# Patient Record
Sex: Female | Born: 1935 | Race: White | Hispanic: No | Marital: Married
Health system: Southern US, Community
[De-identification: ages and names within clinical notes are randomized; demographics above are authoritative.]

## PROBLEM LIST (undated history)

## (undated) DIAGNOSIS — F09 Unspecified mental disorder due to known physiological condition: Secondary | ICD-10-CM

## (undated) DIAGNOSIS — I10 Essential (primary) hypertension: Secondary | ICD-10-CM

## (undated) DIAGNOSIS — M6281 Muscle weakness (generalized): Secondary | ICD-10-CM

## (undated) DIAGNOSIS — R489 Unspecified symbolic dysfunctions: Secondary | ICD-10-CM

## (undated) DIAGNOSIS — S065X9A Traumatic subdural hemorrhage with loss of consciousness of unspecified duration, initial encounter: Secondary | ICD-10-CM

## (undated) DIAGNOSIS — R279 Unspecified lack of coordination: Secondary | ICD-10-CM

## (undated) DIAGNOSIS — S065XAA Traumatic subdural hemorrhage with loss of consciousness status unknown, initial encounter: Secondary | ICD-10-CM

## (undated) DIAGNOSIS — R262 Difficulty in walking, not elsewhere classified: Secondary | ICD-10-CM

## (undated) HISTORY — PX: BRAIN SURGERY: SHX531

---

## 2000-07-04 ENCOUNTER — Encounter: Payer: Self-pay | Admitting: Internal Medicine

## 2000-07-04 ENCOUNTER — Encounter: Admission: RE | Admit: 2000-07-04 | Discharge: 2000-07-04 | Payer: Self-pay | Admitting: Internal Medicine

## 2001-07-05 ENCOUNTER — Encounter: Payer: Self-pay | Admitting: Internal Medicine

## 2001-07-05 ENCOUNTER — Encounter: Admission: RE | Admit: 2001-07-05 | Discharge: 2001-07-05 | Payer: Self-pay | Admitting: Internal Medicine

## 2002-07-08 ENCOUNTER — Encounter: Admission: RE | Admit: 2002-07-08 | Discharge: 2002-07-08 | Payer: Self-pay | Admitting: Internal Medicine

## 2002-07-08 ENCOUNTER — Encounter: Payer: Self-pay | Admitting: Internal Medicine

## 2003-07-10 ENCOUNTER — Encounter: Admission: RE | Admit: 2003-07-10 | Discharge: 2003-07-10 | Payer: Self-pay | Admitting: Internal Medicine

## 2003-07-10 ENCOUNTER — Encounter: Payer: Self-pay | Admitting: Internal Medicine

## 2003-08-21 ENCOUNTER — Inpatient Hospital Stay (HOSPITAL_COMMUNITY): Admission: EM | Admit: 2003-08-21 | Discharge: 2003-08-24 | Payer: Self-pay | Admitting: Emergency Medicine

## 2003-08-22 ENCOUNTER — Encounter: Payer: Self-pay | Admitting: Cardiology

## 2004-07-19 ENCOUNTER — Encounter: Admission: RE | Admit: 2004-07-19 | Discharge: 2004-07-19 | Payer: Self-pay | Admitting: Internal Medicine

## 2004-12-27 ENCOUNTER — Ambulatory Visit (HOSPITAL_COMMUNITY): Admission: RE | Admit: 2004-12-27 | Discharge: 2004-12-27 | Payer: Self-pay | Admitting: Family Medicine

## 2005-07-20 ENCOUNTER — Ambulatory Visit (HOSPITAL_COMMUNITY): Admission: RE | Admit: 2005-07-20 | Discharge: 2005-07-20 | Payer: Self-pay | Admitting: Family Medicine

## 2005-08-17 ENCOUNTER — Ambulatory Visit (HOSPITAL_COMMUNITY): Admission: RE | Admit: 2005-08-17 | Discharge: 2005-08-17 | Payer: Self-pay | Admitting: Family Medicine

## 2006-02-15 ENCOUNTER — Ambulatory Visit (HOSPITAL_COMMUNITY): Admission: RE | Admit: 2006-02-15 | Discharge: 2006-02-15 | Payer: Self-pay | Admitting: Family Medicine

## 2006-08-21 ENCOUNTER — Ambulatory Visit (HOSPITAL_COMMUNITY): Admission: RE | Admit: 2006-08-21 | Discharge: 2006-08-21 | Payer: Self-pay | Admitting: Family Medicine

## 2006-10-31 ENCOUNTER — Ambulatory Visit (HOSPITAL_COMMUNITY): Admission: RE | Admit: 2006-10-31 | Discharge: 2006-10-31 | Payer: Self-pay | Admitting: Family Medicine

## 2007-03-27 ENCOUNTER — Inpatient Hospital Stay (HOSPITAL_COMMUNITY): Admission: AD | Admit: 2007-03-27 | Discharge: 2007-04-03 | Payer: Self-pay | Admitting: Neurological Surgery

## 2007-03-27 ENCOUNTER — Encounter: Payer: Self-pay | Admitting: Emergency Medicine

## 2007-04-03 ENCOUNTER — Inpatient Hospital Stay
Admission: AD | Admit: 2007-04-03 | Discharge: 2011-12-07 | Disposition: A | Discharge status: Nursing Home (Includes ICF) | Payer: Self-pay | Source: Home / Self Care | Attending: Internal Medicine | Admitting: Internal Medicine

## 2007-04-03 DIAGNOSIS — R05 Cough: Secondary | ICD-10-CM

## 2007-09-27 ENCOUNTER — Ambulatory Visit (HOSPITAL_COMMUNITY): Admission: RE | Admit: 2007-09-27 | Discharge: 2007-09-27 | Payer: Self-pay | Admitting: Internal Medicine

## 2009-01-11 IMAGING — CR DG HIP (WITH OR WITHOUT PELVIS) 2-3V*L*
3 series · 3 of 3 positions shown · non-contrast
Comparison: None.

CLINICAL DATA: Status post fall.  Left knee and hip pain.  
LEFT HIP ? 3 VIEW:

[view not recorded (1 of 3)]
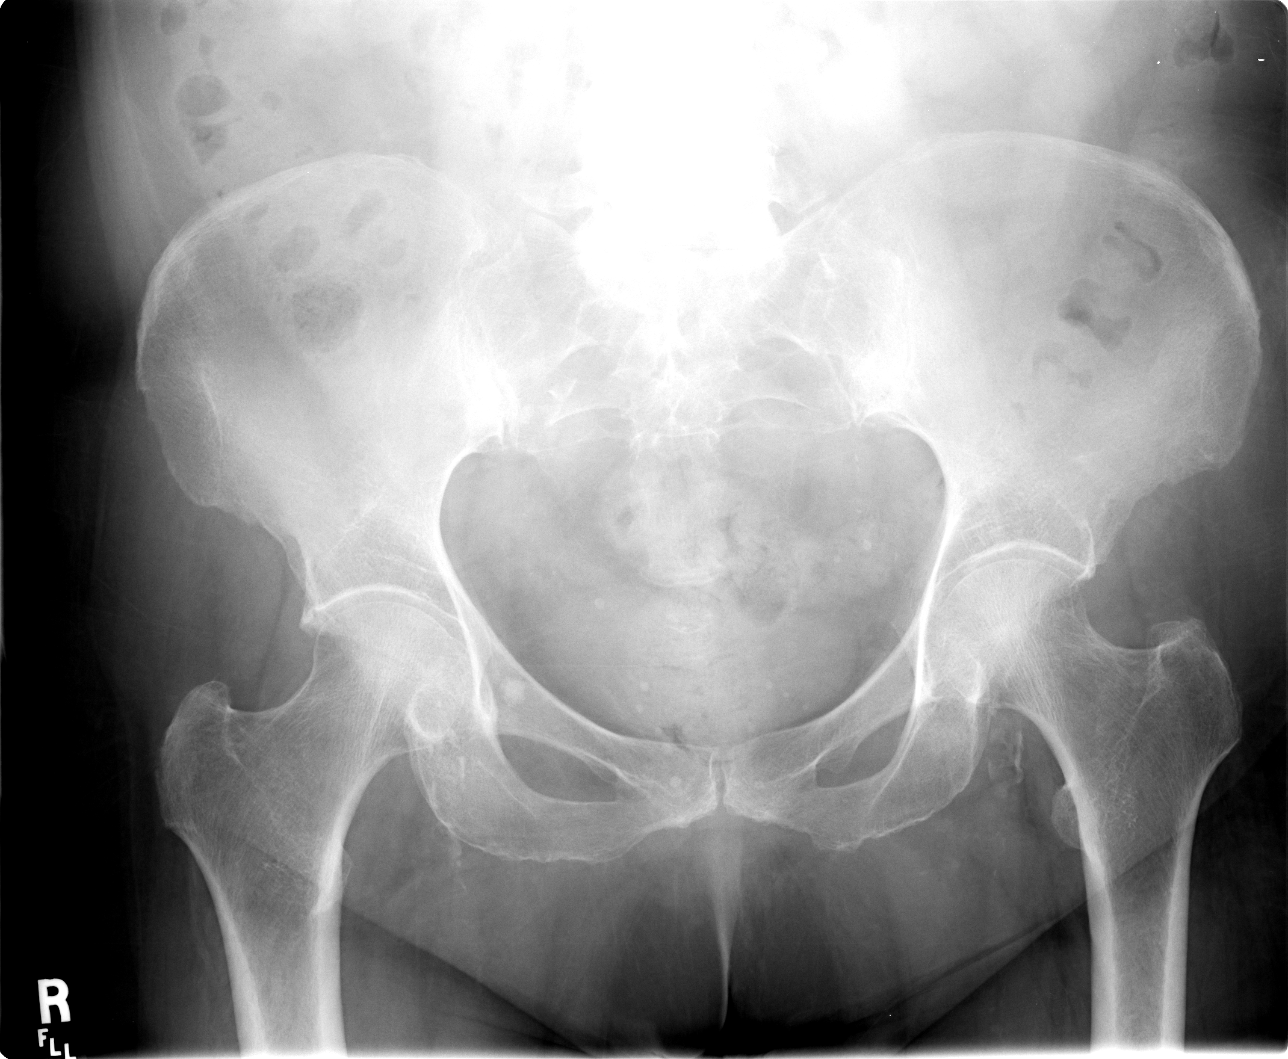

[view not recorded (2 of 3)]
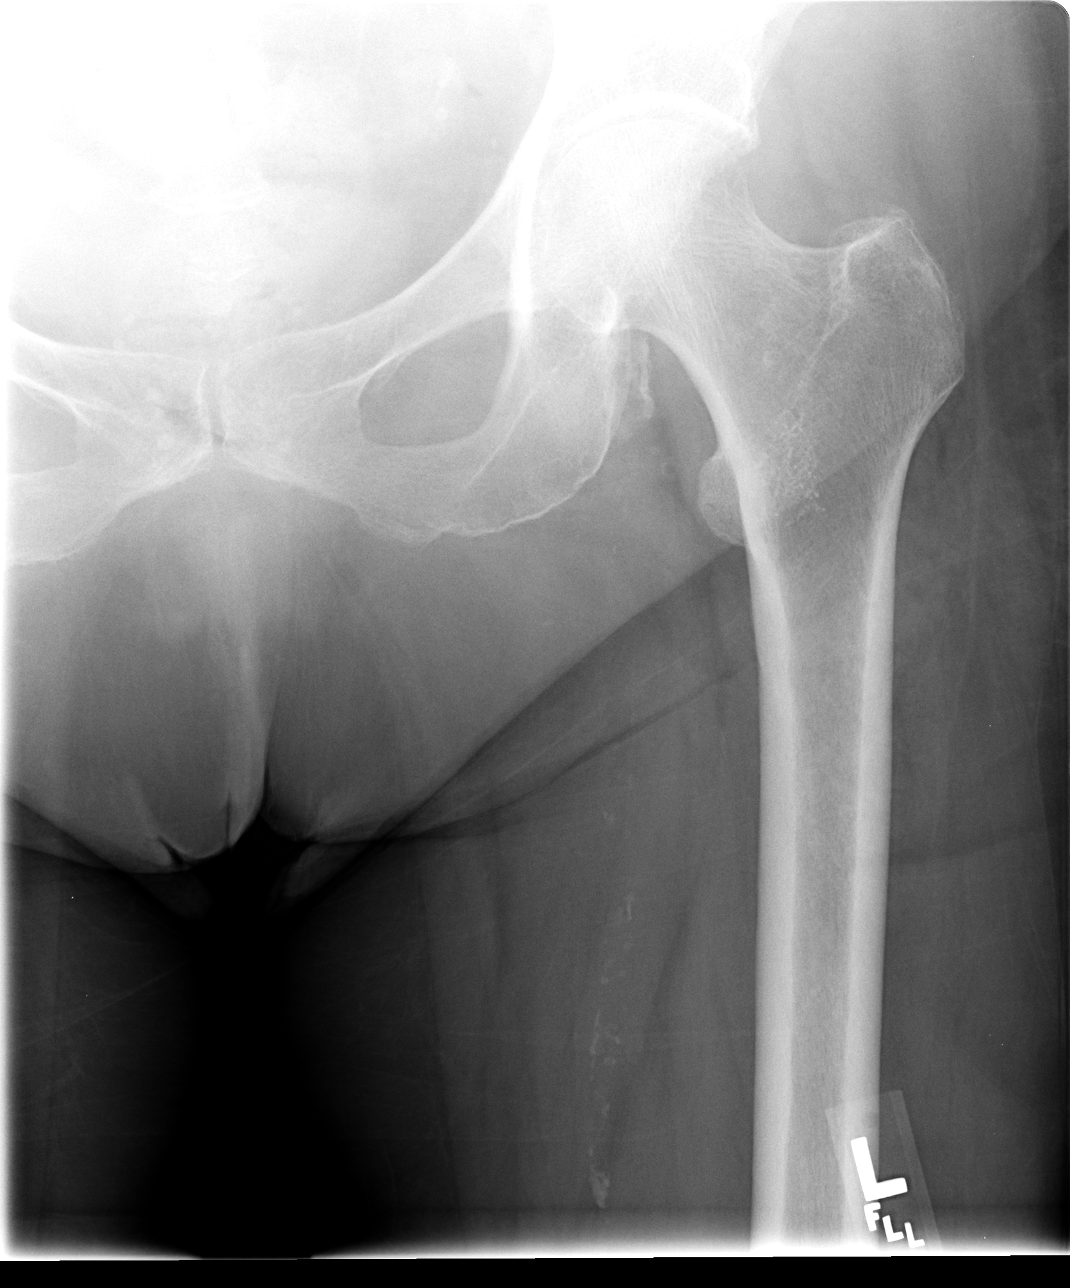

[view not recorded (3 of 3)]
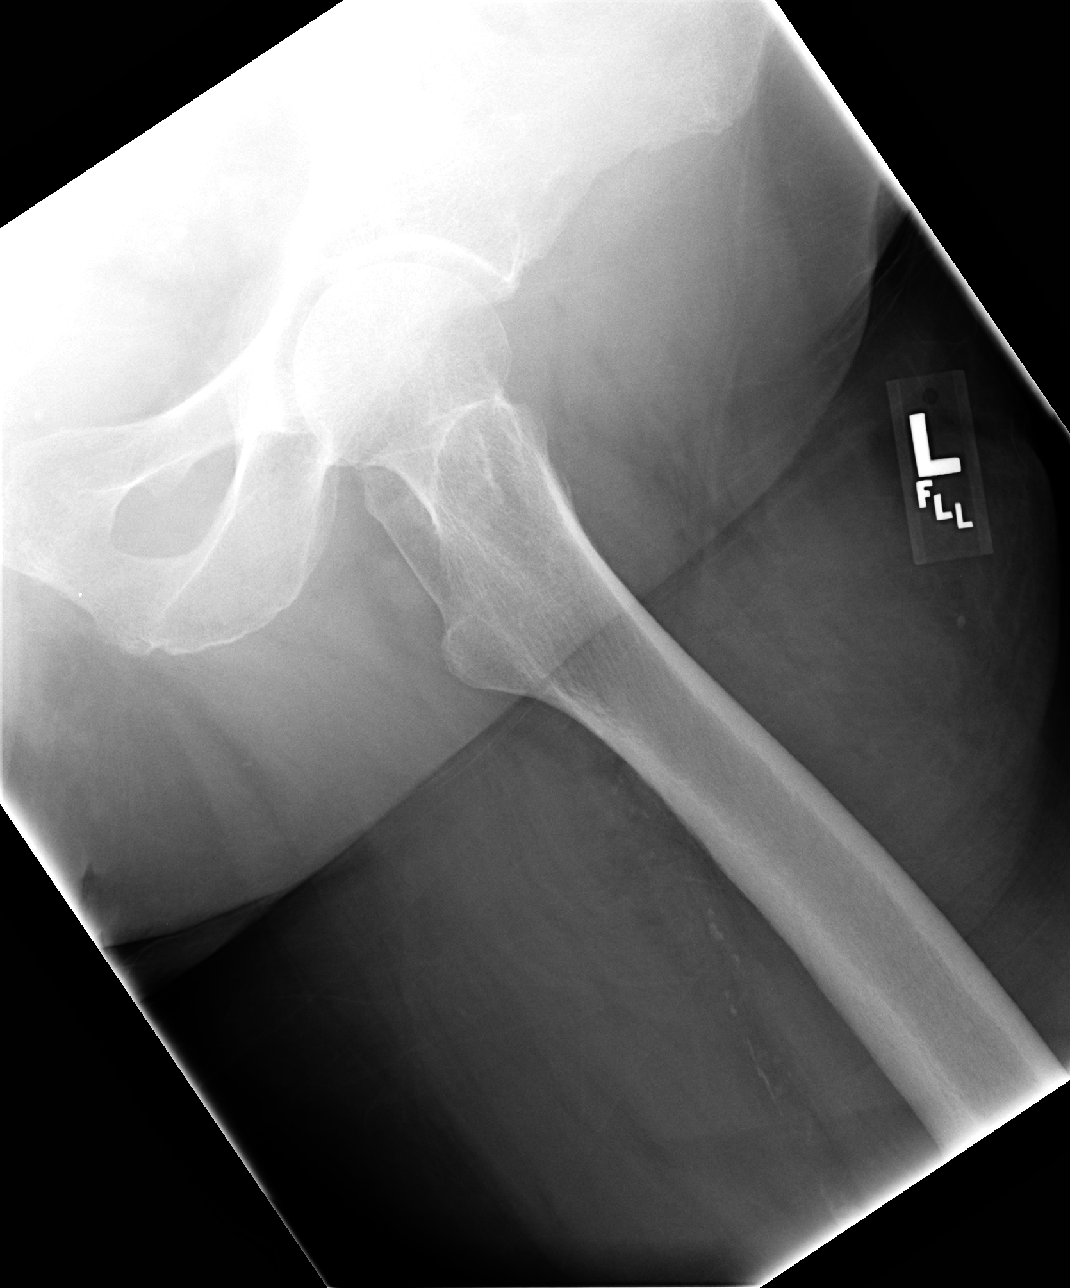

[3 of 3 positions shown; findings below may reference images not displayed]

FINDINGS: There is no evidence of acute fracture or dislocation.  Mild degenerative changes of both hips are present.  The mineralization appears adequate for age.  Vascular calcifications are noted.
IMPRESSION: No evidence of acute fracture or dislocation.  If the patient remains symptomatic, radiographic follow-up would be advised, as acute hip fractures can be radiographically occult in the elderly.  
LEFT KNEE ? 4 VIEW:
FINDINGS: There is prominent prepatellar soft tissue swelling.  No significant knee joint effusion is seen.  There is no evidence of acute fracture or dislocation.   There is minimal medial joint space loss and faint calcifications of the popliteal artery are noted.
IMPRESSION: Prepatellar soft tissue swelling.  No evidence of acute fracture or dislocation.

## 2009-09-21 ENCOUNTER — Emergency Department (HOSPITAL_COMMUNITY): Admission: EM | Admit: 2009-09-21 | Discharge: 2009-09-21 | Payer: Self-pay | Admitting: Emergency Medicine

## 2010-12-05 LAB — URINALYSIS, ROUTINE W REFLEX MICROSCOPIC
Bilirubin Urine: NEGATIVE
Glucose, UA: NEGATIVE mg/dL
Hgb urine dipstick: NEGATIVE
Ketones, ur: NEGATIVE mg/dL
Nitrite: POSITIVE — AB
Protein, ur: NEGATIVE mg/dL
Specific Gravity, Urine: 1.025 (ref 1.005–1.030)
Urobilinogen, UA: 0.2 mg/dL (ref 0.0–1.0)
pH: 5.5 (ref 5.0–8.0)

## 2010-12-05 LAB — COMPREHENSIVE METABOLIC PANEL
ALT: 23 U/L (ref 0–35)
AST: 27 U/L (ref 0–37)
Albumin: 3.5 g/dL (ref 3.5–5.2)
Alkaline Phosphatase: 73 U/L (ref 39–117)
BUN: 20 mg/dL (ref 6–23)
CO2: 29 mEq/L (ref 19–32)
Calcium: 9.1 mg/dL (ref 8.4–10.5)
Chloride: 103 mEq/L (ref 96–112)
Creatinine, Ser: 1.1 mg/dL (ref 0.4–1.2)
GFR calc Af Amer: 58 mL/min — ABNORMAL LOW (ref >60)
GFR calc non Af Amer: 48 mL/min — ABNORMAL LOW (ref >60)
Glucose, Bld: 114 mg/dL — ABNORMAL HIGH (ref 70–99)
Potassium: 4 mEq/L (ref 3.5–5.1)
Sodium: 141 mEq/L (ref 135–145)
Total Bilirubin: 0.4 mg/dL (ref 0.3–1.2)
Total Protein: 6.9 g/dL (ref 6.0–8.3)

## 2010-12-05 LAB — POCT CARDIAC MARKERS
CKMB, poc: <1 ng/mL — ABNORMAL LOW (ref 1.0–8.0)
Myoglobin, poc: 53.1 ng/mL (ref 12–200)
Troponin i, poc: <0.05 ng/mL (ref 0.00–0.09)

## 2010-12-05 LAB — DIFFERENTIAL
Basophils Absolute: 0 10*3/uL (ref 0.0–0.1)
Basophils Relative: 0 % (ref 0–1)
Eosinophils Absolute: 0.1 10*3/uL (ref 0.0–0.7)
Eosinophils Relative: 1 % (ref 0–5)
Lymphocytes Relative: 29 % (ref 12–46)
Lymphs Abs: 2.3 10*3/uL (ref 0.7–4.0)
Monocytes Absolute: 0.5 10*3/uL (ref 0.1–1.0)
Monocytes Relative: 6 % (ref 3–12)
Neutro Abs: 5 10*3/uL (ref 1.7–7.7)
Neutrophils Relative %: 63 % (ref 43–77)

## 2010-12-05 LAB — CBC
HCT: 39.4 % (ref 36.0–46.0)
Hemoglobin: 12.9 g/dL (ref 12.0–15.0)
MCHC: 32.7 g/dL (ref 30.0–36.0)
MCV: 89.4 fL (ref 78.0–100.0)
Platelets: 224 10*3/uL (ref 150–400)
RBC: 4.4 MIL/uL (ref 3.87–5.11)
RDW: 13.5 % (ref 11.5–15.5)
WBC: 8 10*3/uL (ref 4.0–10.5)

## 2010-12-05 LAB — URINE CULTURE: Colony Count: >=100000

## 2010-12-05 LAB — URINE MICROSCOPIC-ADD ON

## 2011-02-01 NOTE — Group Therapy Note (Signed)
NAME:  Madeline Alvarez, Madeline Alvarez NO.:  [id number]   MEDICAL RECORD NO.:  [id number]          PATIENT TYPE:  ORB   LOCATION:  S141                          FACILITY:  [facility]   PHYSICIAN:  Catalina Pizza, M.D.        DATE OF BIRTH:  1925-07-13   DATE OF PROCEDURE:  DATE OF DISCHARGE:                                 PROGRESS NOTE   SUBJECTIVE:  Madeline Alvarez is an 77 year old white female, resident at  [facility].  She did not have any complaints.  No significant  changes per staff.  She has not had as many episodes lashing out at  staff and residents, does have a calm demeanor at this time.  She is  eating, drinking well, ambulating without difficulty.   OBJECTIVE:  VITAL SIGNS:  Stable.  GENERAL:  This is a pleasant female sitting in chair in no acute  distress.  HEENT:  Unremarkable.  CARDIOVASCULAR:  Regular rate and rhythm.  LUNGS:  Clear to auscultation bilaterally.  ABDOMEN:  Soft, mildly protuberant, but nontender, positive bowel  sounds.  EXTREMITIES:  No lower extremity edema.  NEUROLOGIC:  Patient is alert and oriented to person and place, not  specific to time, but able to answer questions and follow directions  appropriately.   No new laboratory data obtained thus far.   ASSESSMENT AND PLAN:  1. Hypertension, under good control.  2. Alzheimer dementia.  Continue on Aricept, Namenda.  3. Anxiety and agitation.  We will continue on her current therapy      with the Lexapro and Xanax and increase her Risperdal if needed.  4. Coronary artery disease, history of transient ischemic attack.      Continue on aspirin.  5. Hyperlipidemia.  Continue on Lipitor.  We will check her upcoming      blood work to see if anything needs to be changed, but continue on      current therapy.   DISPOSITION:  We will continue to monitor and follow up as needed.      Catalina Pizza, M.D.     ZH/MEDQ  D:  05/18/2009  T:  05/19/2009  Job:  [id number]

## 2011-02-01 NOTE — Group Therapy Note (Signed)
NAME:  JAYDAH, STAHLE NO.:  [id number]   MEDICAL RECORD NO.:  [id number]          PATIENT TYPE:  ORB   LOCATION:  s141                          FACILITY:  [facility]   PHYSICIAN:  Catalina Pizza, M.D.        DATE OF BIRTH:  01/09/1925   DATE OF PROCEDURE:  12/04/2008  DATE OF DISCHARGE:                                 PROGRESS NOTE   SUBJECTIVE:  Ms. Jarrett is an 77 year old white female resident of  [facility].  She has no complaints at this time.  Did ambulate  with the patient down the hall without any difficulty.  She is eating  and drinking well.  Per the nursing, she has had a little bit of  sundowning but is good on some antianxiety medicines in the evening.   PHYSICAL EXAMINATION:  VITAL SIGNS:  Temperature is 97.8, blood pressure  120/78, up to as high of 147/77, respiratory rate is 16-20, heart rate  is 57.  GENERAL:  This is a pleasant white female in no acute distress.  HEENT:  Unremarkable.  CARDIOVASCULAR:  Regular rate and rhythm.  LUNGS:  Clear to auscultation bilaterally.  ABDOMEN:  Soft, nontender, nondistended, positive bowel sounds.  EXTREMITIES:  No lower extremity edema.  NEUROLOGIC:  The patient is alert and oriented to person and place and  may be not specific to time.  She does have significant forgetfulness  and continued to ask me whether I was her doctor.  She does answer all  the questions appropriately and is able to do most ADLs for herself of  course with assistance with meals from the nursing home.   IMPRESSION:  An 77 year old white female with chronic mild Alzheimer  dementia, resident of nursing facility.   ASSESSMENT AND PLAN:  1. Alzheimer dementia.  Continue with Aricept and Namenda.  2. Anxiety and depression.  Continue with the Lexapro as well as the      Ativan for sundowning-type agitation and hyperlipidemia.  Continue      with Lipitor, coronary artery disease, history of TIAs.  Continue      on aspirin.  3.  Hypertension.  Doing well.  Continue on current therapy.  Continue      to monitor monthly.      Catalina Pizza, M.D.  Electronically Signed     ZH/MEDQ  D:  12/21/2008  T:  12/21/2008  Job:  [id number]

## 2011-02-01 NOTE — Group Therapy Note (Signed)
NAME:  Madeline Alvarez, Madeline Alvarez NO.:  [id number]   MEDICAL RECORD NO.:  [id number]          PATIENT TYPE:  ORB   LOCATION:  S141                          FACILITY:  [facility]   PHYSICIAN:  Catalina Pizza, M.D.        DATE OF BIRTH:  16-Oct-1924   DATE OF PROCEDURE:  12/06/2007  DATE OF DISCHARGE:                                 PROGRESS NOTE   SUBJECTIVE:  Madeline Alvarez is an 77 year old white female resident at  [facility] who, at this time, does not have any significant  complaints.  She is doing well.  She is recovering from cold and cough-  type symptoms which she had back in January with no other sequela this  time.  She states she is eating, drinking well, sleeping well, and  really overall doing much better.   OBJECTIVE:  VITAL SIGNS:  Temperature is 97.3, blood pressure is 181/71  today, was normal last check last week.  Heart rate is 52, respirations  are 18.  GENERAL:  This is a pleasant elderly female sitting in chair in no acute  distress.  HEENT:  Is unremarkable.  HEART:  Is regular rate and rhythm.  No murmurs, gallops or rubs.  LUNGS:  Are clear to auscultation bilaterally.  No rhonchi or wheezing.  ABDOMEN:  Is soft, nontender, positive bowel sounds.  EXTREMITIES:  No lower extremity edema.  MUSCULOSKELETAL:  No deficits noted.  NEUROLOGIC:  The patient is alert and oriented to person and place not  specifically to time.  Emotionally, she appears to be doing well.   IMPRESSION:  This is an 77 year old white female with some Alzheimer's  dementia, also possibly some sequela from previous subdural hematoma.   ASSESSMENT/PLAN:  1. Alzheimer's dementia.  Continue on the Aricept and Namenda.      Appears to be keeping things stable at this time.  2. Anxiety/depression.  Continue on Lexapro at 20 mg once daily.      Appears to be working well at this time.  She does have bouts of      some getting down in the dumps but, overall, appears to be stable.  3.  Hyperlipidemia, carotid artery disease, history of transient      ischemic attacks.  Continue on Lipitor and Tricor as previous      prescribed.  4. Hypertension.  Unclear why her blood pressure was up today.  She is      on atenolol and clonidine patch.  Will get this monitored daily for      the next week and to alert me if she is having systolic blood      pressure greater than 160 because may need to add further      medications onto her regimen.   DISPOSITION:  The patient will continue on current therapy and no  specific change.  Will follow up routine evaluation in another month.      Catalina Pizza, M.D.  Electronically Signed     ZH/MEDQ  D:  12/06/2007  T:  12/06/2007  Job:  [id number]

## 2011-02-01 NOTE — Group Therapy Note (Signed)
NAME:  Madeline Alvarez, SEGALL NO.:  [id number]   MEDICAL RECORD NO.:  [id number]          PATIENT TYPE:  ORB   LOCATION:  S141                          FACILITY:  [facility]   PHYSICIAN:  Catalina Pizza, M.D.        DATE OF BIRTH:  04-23-1925   DATE OF PROCEDURE:  10/17/2007  DATE OF DISCHARGE:                                 PROGRESS NOTE   SUBJECTIVE:  Ms. Glanz is an 77 year old white female resident of  [facility] who apparently has had cough and cold-type symptoms  for several weeks now.  Her daughter was more alarmed at her lethargic  nature.  Apparently when she had visited and with her further checked  out, she has had a negative chest x-ray recently and in discussion with  the patient states that her cough, although continued, is not keeping  her up at night and is improved from previous.  She is not bringing up  much when she coughs at this time, but when she does it does have a  yellow-green sputum.  She denies any fever or chills at this time.   OBJECTIVE:  VITAL SIGNS:  Temperature is 98.6, blood pressure is 128/75,  pulse is 75.  Respirations are 16.  She is satting 98% on room air.  GENERAL:  This is a pleasant elderly female sitting in chair in no acute  distress.  HEENT is unremarkable.  Heart is regular rate and rhythm.  No murmurs, gallops or rubs.  Lungs are clear to auscultation bilaterally, possibly some upper airway  noise but did not appreciate any crackles or rhonchi.  Abdomen is soft, nontender, positive bowel sounds.  EXTREMITIES:  No lower extremity edema.  MUSCULOSKELETAL:  No deficits.  NEUROLOGIC:  Patient is alert and oriented to person and place but not  specifically to time.  She is not as emotional as she was before.   IMPRESSION:  This is an 77 year old white female with some Alzheimer's  dementia who are apparently has had a prolonged URI with sputum  production.   ASSESSMENT/PLAN:  1. Upper respiratory infection.  It is  unclear.  She has been afebrile      but per report from her daughter,  she has never responded to just      Mucinex she has required antibiotics before.  She has no signs of      pneumonia, clinically, but given her green sputum production, will      go ahead and treat her with a 5-day course of Zithromax.  And      continue treat symptomatically with Robitussin.  She is having slow      improvement with her cough as compared to several weeks ago and it      does not appear to be causing any significant distress.  The      patient states she is sleeping well without any difficulty from a      cough.  She may have some reactive airway component, bronchitis      type component following a viral URI.  2. Dementia and appears  to be stable at this time.  3. Anxiety, depression she still does seem to be a little gloomy and      will continue on her Lexapro as previously prescribed.  Overall she      does appear to be better than she did several weeks ago and will      need to continue to nurture her in the nursing home environment to      help out with this.      Catalina Pizza, M.D.  Electronically Signed     ZH/MEDQ  D:  10/22/2007  T:  10/23/2007  Job:  [id number]

## 2011-02-01 NOTE — H&P (Signed)
Madeline Alvarez, Madeline Alvarez             ACCOUNT NO.:  [id number]   MEDICAL RECORD NO.:  [id number]          PATIENT TYPE:  INP   LOCATION:  3113                         FACILITY:  [facility]   PHYSICIAN:  Tia Alert, MD     DATE OF BIRTH:  10-03-1924   DATE OF ADMISSION:  03/27/2007  DATE OF DISCHARGE:                              HISTORY & PHYSICAL   ADMITTING DIAGNOSIS:  Left subdural hematoma after a fall.   HISTORY OF PRESENT ILLNESS:  Madeline Alvarez is an 77 year old female  with a history of Alzheimer's dementia and hypertension, who was  transferred from [facility] emergency department for further evaluation  and management of a subdural hematoma suffered after a fall some time  earlier this morning.  The patient has a sitter who stays with her  during the day, but leaves her alone at night.  Some time during the day  and night, or early hours of the morning, the patient apparently fell;  however, the patient has no recall of the event.  It is unknown whether  she had loss of consciousness.  Her son came over and noticed bruising  to her forehead.  They took her to the emergency department, where she  was found to have bruising to her left knee, swelling around her left  hip, and bruising to her face.   CT scan of the head and cervical spine was performed, and plain films of  the knee and left hip were performed.  The plain films showed no  evidence of fracture.  The CT scan was read as normal, but the CT scan  of the head showed a left acute subdural hematoma without significant  mass effect or shift, and neurosurgical evaluation was requested.  We  then accepted the patient in transfer here.   She denies any headaches.  She is awake and alert, and pleasant and  interactive, but has poor short-term memory.  She denies any significant  visual changes, or any numbness, tingling or weakness, and there has  been no apparent seizure activity.  She is on aspirin and Plavix for a  history of what sounds like a TIA, and has apparently carotid artery  disease according to the family.  She has had a more remote fall, which  was the cause of the swelling of the left knee.  This fall was  unwitnessed.   PAST MEDICAL HISTORY:  1. Hysterectomy.  2. Hypertension.  3. Alzheimer's dementia.  4. TIA.  5. Carotid artery disease.  6. Dyslipidemia.   MEDICATIONS:  1. Aricept 10 mg p.o. b.i.d.  2. Aspirin 81 mg p.o. daily.  3. Atenolol 50 mg p.o. daily.  4. Lipitor 80 mg daily.  5. Namenda 10 mg b.i.d.  6. Paxil 20 mg a day.  7. Plavix 75 mg a day.  8. Tricor 48 mg a day.   ALLERGIES:  No known drug allergies.   SOCIAL HISTORY:  Again, lives alone.  Is fairly independent, but has a  sitter who comes on with her during the day.   PHYSICAL EXAMINATION:  VITAL SIGNS:  Temperature 98.2,  pulse 54, blood  pressure 140/78, respirations 16, oxygen saturation is 96% on room air.  GENERAL:  Pleasant, cooperative, elderly female lying in a stretcher.  HEENT:  She has bruising and some swelling to the left forehead and to  the left occipital region.  There is no laceration.  Extraocular  movements are intact.  NECK:  Supple and nontender.  HEART:  Regular rate and rhythm.  EXTREMITIES:  She has swelling over the left hip and upper thigh  consistent with hematoma.  She has significant swelling and bruising  around the left knee, which appears to be old.  She is somewhat tender  around the swelling of the left hip, but seems to move the hip fine.  NEUROLOGICAL:  She is awake and alert.  She is pleasant and interactive,  but has poor short-term memory.  She is conversive.  No facial asymmetry  and no pronator drift.  Her strength seems to be good throughout.  Sensation is grossly intact.  Gait is not tested.   IMAGING STUDIES AND LABS:  Her CBC and electrolytes from the outside  institution were reviewed, and there are no significant abnormalities.  I cannot find any  coagulation studies that were done.  Her urinalysis  showed many bacteria and was positive for nitrites consistent with a  urinary tract infection.   CT scan shows a left frontal parietal subdural hematoma.  It is very  small without significant mass effect in the frontal region, but as it  gets to the parietal region, thickens to a maximum diameter of 1.3 cm  focally without significant shift or significant mass effect.  She has  significant atrophy of the brain.  The basal systems are open.  Do not  see a significant skull fracture.  CT scan of cervical spine shows no  obvious fracture, subluxation or deformity, and plain films of her hip  and left knee were reviewed and show no evidence of fracture per the  radiology report.   ASSESSMENT AND PLAN:  This is an 77 year old female with a history of  Alzheimer's dementia and possible transient ischemic attack with carotid  artery disease, who has had a couple of recent falls.  She has bruising  to her left hip and knee.  I do not believe we need an orthopedics  consult at this point, but we do need to watch the hematoma in the soft  tissues and make sure this does not expand, and make sure there is no  risk of compartment syndrome, but I do not think that is going to be an  issue.  The plain films look good according to the radiology report.  Cervical spine seems to be okay.  Her head CT shows an acute subdural  hematoma, but this does not need surgical treatment at this point unless  it were to grow and cause significant mass effect and neurologic  changes.  At this point, she seems to be at her neurologic baseline and  denies any headache.  We will repeat her head CT in the morning, sooner  if there is any change in her neurologic status.  We will hold her  aspirin and Plavix.  We will start physical and occupational therapy.  She will likely need nursing home placement upon discharge.  We will  keep her in the intensive care unit with  neurological checks every hour,  and we will go ahead and check coagulation studies on her at this point.  Tia Alert, MD  Electronically Signed    DSJ/MEDQ  D:  03/27/2007  T:  03/27/2007  Job:  [id number]

## 2011-02-01 NOTE — Group Therapy Note (Signed)
NAME:  Madeline Alvarez, Madeline Alvarez NO.:  [id number]   MEDICAL RECORD NO.:  [id number]           PATIENT TYPE:   LOCATION:  S141                          FACILITY:  [facility]   PHYSICIAN:  Catalina Pizza, M.D.        DATE OF BIRTH:  May 09, 1925   DATE OF PROCEDURE:  04/03/2008  DATE OF DISCHARGE:                                 PROGRESS NOTE   SUBJECTIVE:  Madeline Alvarez is an 77 year old white female resident at  [facility] who at this time has no complaints.  She states she  is eating will and doing well and not in any pains.   REVIEW OF SYSTEMS:  A full review of systems is negative.  No problems  with abdominal pain, constipation, or diarrhea.   OBJECTIVE:  VITAL SIGNS:  Temperature is 97.6, blood pressure 146/72,  pulse 60, and respirations 20.  GENERAL:  This is a pleasant white female, sitting in chair, in no acute  distress.  HEENT:  Unremarkable.  Pupils are equal, round, and reactive to light  and accommodation.  HEART:  Regular rate and rhythm.  No murmurs, gallops, or rubs.  LUNGS:  Clear to auscultation bilaterally.  No rhonchi or wheezing.  ABDOMEN:  Soft, nontender, and nondistended.  Positive bowel sounds.  EXTREMITIES:  No lower extremity edema.  MUSCULOSKELETAL:  Moving all extremities without difficulty, 5/5  strength.  NEUROLOGIC:  The patient is alert and oriented x3, may have a little  mental deficit, does not specifically remember me and kept questioning  me as far as who had sent me, but no other specific neurologic deficits.   IMPRESSION:  An 77 year old white female with dementia, in nurse  facility.   ASSESSMENT AND PLAN:  1. Alzheimer dementia, continue on the Aricept and Namenda.  2. Anxiety and depression, continue on the Lexapro, appears to be      working well.  3. Hyperlipidemia.  4. Carotid artery disease.  5. History of transient ischemic attack, continue Lipitor and TriCor,      preventive measures.  6. Hypertension.  Appears to be  doing well.  Continue to monitor at      this time.   DISPOSITION:  The patient appears to be doing well and we will continue  on her current therapy.      Catalina Pizza, M.D.  Electronically Signed    ZH/MEDQ  D:  04/03/2008  T:  04/04/2008  Job:  [id number]

## 2011-02-01 NOTE — Group Therapy Note (Signed)
NAME:  Madeline Alvarez, PLACKE NO.:  [id number]   MEDICAL RECORD NO.:  [id number]          PATIENT TYPE:  ORB   LOCATION:  S141                          FACILITY:  [facility]   PHYSICIAN:  Catalina Pizza, M.D.        DATE OF BIRTH:  May 14, 1925   DATE OF PROCEDURE:  07/24/2008  DATE OF DISCHARGE:                                 PROGRESS NOTE   SUBJECTIVE:  Madeline Alvarez is an 77 year old white female in usual  state of health at [facility] with no acute changes in her  chronic medical issues.  She states she is feeling fine, eating fine,  getting around without any difficulty at this time.   Medications were reviewed.   PHYSICAL EXAMINATION:  VITAL SIGNS:  Temperature was 98.0, blood  pressure was 128/72, pulse is 68, respirations were 16.  GENERAL:  This is a pleasant elderly female sitting in chair in no acute  distress.  HEENT:  Unremarkable.  LUNGS:  Clear to auscultation bilaterally.  No rhonchi or wheezing.  ABDOMEN:  Soft, nontender, nondistended, positive bowel sounds.  HEART:  Regular rate and rhythm.  No murmurs, gallops, or rubs.  EXTREMITIES:  No lower extremity edema.  NEUROLOGIC:  The patient is alert and oriented x3.  She does have some  forgetfulness, but is able to carry on a conversation well and answer  questions appropriately.  No other neurologic deficits noted.   IMPRESSION:  An 83-year white female with chronic mild Alzheimer  dementia, resident at nursing facility with no acute medical issues.   ASSESSMENT AND PLAN:  1. Alzheimer dementia.  Continue with Aricept and Namenda.  2. Anxiety and depression.  Continue with Lexapro at 10.  She appears      to be doing well at that dosage.  3. Hyperlipidemia.  We will continue on the Lipitor.  4. Carotid artery disease, history of transient ischemic attacks.      Continue with the aspirin as well as treat the hyperlipidemia.  5. Hypertension.  Continue with her current regimen.  With this, blood  pressure appear to be doing well.   DISPOSITION:  We will follow the patient in a month if further issues  arise.     Catalina Pizza, M.D.  Electronically Signed    ZH/MEDQ  D:  07/24/2008  T:  07/24/2008  Job:  [id number]

## 2011-02-01 NOTE — Group Therapy Note (Signed)
NAME:  Madeline Alvarez, Madeline Alvarez NO.:  [id number]   MEDICAL RECORD NO.:  [id number]          PATIENT TYPE:  ORB   LOCATION:  S141                           FACILITY:   PHYSICIAN:  Catalina Pizza, M.D.        DATE OF BIRTH:  July 05, 1925   DATE OF PROCEDURE:  09/29/2007  DATE OF DISCHARGE:                                 PROGRESS NOTE   SUBJECTIVE:  Madeline Alvarez is an 77 year old pleasant white female  resident of [facility] who apparently, for the last two or  three days has had worsening cough and overall has felt not that great.  Appears to have a upper respiratory infection.  Also of note, she has  become a little more confused and a little more reactive to both the  nurses as well as to her roommate.  She has been crying and emotional  more often for last several days as well.   PHYSICAL EXAMINATION:  VITAL SIGNS:  Temperature:  She has been  afebrile.  Her blood pressures have been 118/63, pulse is 72,  respirations 16.  GENERAL:  This is a pleasant, elderly white female somewhat tearful.  HEENT:  Unremarkable.  Mucous membranes are moist.  HEART:  Regular rate and rhythm.  LUNGS:  Clear to auscultation bilaterally.  Do not appreciate any  rhonchi or wheezing.  Question some upper airway noise noted.  ABDOMEN:  Soft, nontender, positive bowel sounds.  EXTREMITIES:  No lower extremity edema.  MUSCULOSKELETAL:  No deficits noted.  NEUROLOGIC:  The patient is alert and oriented to person and place, but  some of which she says is out of the ordinary.  She is more emotional  today than she was a month ago and saying that she just  found out that  some of her family members were sick and no one had told her.   LABORATORY DATA:  CBC showed white count 9.2, hemoglobin 12.1, platelet  count 298.  BMP was normal except for glucose mildly elevated 121, BUN  24, creatinine 0.97, this was nonfasting.  Urinalysis shows turbid,  nitrates were negative, leukocyte esterase was  trace, did not show any  WBCs, no RBCs and no bacteria noted.   Chest x-ray was also obtained which showed no active cardiopulmonary  disease, showed a 7 mm left upper lobe nodule.  Recommend further  evaluation with routine outpatient noncontrast CT of the chest.   IMPRESSION:  This is an 77 year old white female with Alzheimer's  dementia, very pleasant but motional at this time.  She states she  overall just does not feel good, has not felt good for the last couple  of days, has noted some cough with occasional sputum production, states  she is eating and drinking well without any problems.   ASSESSMENT/PLAN:  1. Alzheimer's dementia.  Continue with Aricept and Namenda.  2. Delirium/psychosis.  She was previously stable.  She is more      emotional at this time.  Will continue on the Lexapro.  Will not      make any major changes to her regimen.  3. Hyperlipidemia, carotid artery disease, history of transient      ischemic attacks.  Continue on her current regimen of Lipitor and      Tricor.  4. History of subdural hematoma.  No further problems related to this.  5. Upper respiratory infection.  Will start on Mucinex 600 mg p.o.      b.i.d. x7 days and this may help with some of this.  Given some of      her lab work, will encourage her to drink more fluids and encourage      her to eat as well.      Catalina Pizza, M.D.  Electronically Signed     ZH/MEDQ  D:  09/29/2007  T:  09/30/2007  Job:  [id number]

## 2011-02-01 NOTE — Discharge Summary (Signed)
NAMEFAYTHE, HEITZENRATER             ACCOUNT NO.:  [id number]   MEDICAL RECORD NO.:  [id number]          PATIENT TYPE:  INP   LOCATION:  3006                         FACILITY:  [facility]   PHYSICIAN:  Tia Alert, MD     DATE OF BIRTH:  14-Mar-1925   DATE OF ADMISSION:  03/27/2007  DATE OF DISCHARGE:  04/03/2007                         DISCHARGE SUMMARY - REFERRING   ADMISSION DIAGNOSIS:  Left subdural hematoma after a fall.   HISTORY OF PRESENT ILLNESS:  Ms. Mack is an 77 year old female  with a history of dementia who was transferred from [facility]  Emergency Department for further evaluation and management of a small  subdural hematoma suffered after a fall.  There was no witness to the  event.  The patient could not recall the event.  She had amnesia for the  event.  She was awake and alert, denied headache and moved all  extremities equally.  She was on aspirin and Plavix for a history of  suspected TIAs.  CT scan showed a small left subdural hematoma without  significant mass effect or stiff.  She was admitted to the neurosurgical  ICU.   HOSPITAL COURSE:  She remained in the ICU over a couple of days where  her neurologic exam remained stable.  She stayed off of her aspirin and  Plavix.  Follow-up CT scan the following morning showed no change in the  subdural hematoma.  She was transferred to the neurosurgical floor for  further care there.  She remained pleasantly demented but otherwise had  a nonfocal neurologic exam.  She worked with physical and occupational  therapy.  She did have a urinary tract infection treated with Bactrim  which caused diarrhea so we switched this to Levaquin.  Her Foley  catheter was discontinued and she continued along this hospital course.  She remained afebrile with stable vital signs.  Her neurologic exam  remained stable.  Her CT scan was repeated once again which showed  resolving subdural hematoma on the left side.  She was  accepted to a  skilled nursing facility where she was discharged on April 03, 2007, in  stable condition.   MEDICATIONS:  1. Aricept 10 mg p.o. b.i.d.  2. Tenormin 50 mg p.o. daily.  3. Namenda 10 mg p.o. b.i.d.  4. Paxil 20 mg p.o. per day.  5. Tricor 48 mg p.o. daily.  6. Clonidine 0.1 mg patch.  7. Lipitor 80 mg a day.  8. She could resume at 81 mg per day but we should hold her Plavix for      now.   ACTIVITY:  As per the skilled nursing facility.   FOLLOW UP:  Her return appointment was with Dr. Marikay Alar in two  weeks.   FINAL DIAGNOSIS:  Left subdural hematoma.      Tia Alert, MD  Electronically Signed     DSJ/MEDQ  D:  04/03/2007  T:  04/03/2007  Job:  [id number] [id number]

## 2011-02-01 NOTE — Group Therapy Note (Signed)
NAME:  DENI, BERTI NO.:  [id number]   MEDICAL RECORD NO.:  [id number]          PATIENT TYPE:  ORB   LOCATION:  S141                          FACILITY:  [facility]   PHYSICIAN:  Catalina Pizza, M.D.        DATE OF BIRTH:  [birth year]   DATE OF PROCEDURE:  11/06/2008  DATE OF DISCHARGE:                                 PROGRESS NOTE   SUBJECTIVE:  Ms. Schmiesing is a 77 year old white female resident of  [facility].  She does not have any complaints at this time.  She is eating and drinking well, ambulating without any difficulty.   PHYSICAL EXAMINATION:  VITAL SIGNS:  Temperature is 97.6, blood pressure  is 118/75, pulse is 72, respirations are 16.  GENERAL:  This is a pleasant white female, sitting in chair, in no acute  distress.  HEENT:  Unremarkable.  CARDIOVASCULAR:  Regular rate and rhythm.  LUNGS:  Clear to auscultation bilaterally.  ABDOMEN:  Soft, nontender, nondistended.  Positive bowel sounds.  EXTREMITIES:  No lower extremity edema.  NEUROLOGIC:  The patient is alert and oriented x3.  She does have some  forgetfulness and does not remember me seeing her approximately 1 month  ago, but moving all extremities.  Able to answer questions  appropriately.  Gait is stable not requiring any assistance.   IMPRESSION:  A 77 year old white female with chronic mild Alzheimer's  dementia resident nursing at nursing facility.   ASSESSMENT/PLAN:  1. Alzheimer's dementia.  Continue on Aricept, Namenda.  2. Anxiety, depression.  Continue on Lexapro.  3. P.r.n. Ativan for sundowning and agitation which she did have some      problems before, but not as much now.  4. Hyperlipidemia.  Continue on Lipitor.  5. Coronary artery disease, history of TIAs.  Continue on aspirin.  6. Hypertension, doing well.  Continue on current therapy.  We will      recheck again in another 4-6 weeks.      Catalina Pizza, M.D.  Electronically Signed     ZH/MEDQ  D:  11/09/2008  T:   11/10/2008  Job:  [id number]

## 2011-02-01 NOTE — Group Therapy Note (Signed)
NAME:  JERRIANN, SCHROM NO.:  [id number]   MEDICAL RECORD NO.:  [id number]          PATIENT TYPE:  ORB   LOCATION:  S141                          FACILITY:  [facility]   PHYSICIAN:  Catalina Pizza, M.D.        DATE OF BIRTH:  [birth year]   DATE OF PROCEDURE:  08/23/2007  DATE OF DISCHARGE:                                 PROGRESS NOTE   SUBJECTIVE:  [name] is an 77 year old pleasant white female  resident at [facility] who has several chronic medical issues  which have remained stable and overall appears to doing very well.  She  has no complaints.  She is eating and drinking well and ambulate with  walker with a very minimal need for any assistance.   PAST MEDICAL HISTORY:  Significant for Alzheimer's dementia,  hypertension, history of TIAs, history of carotid artery disease and  hyperlipidemia.   SOCIAL HISTORY:  She has a daughter and son-in-law who check on her  routinely.   MEDICATIONS:  1. Atenolol 50 mg p.o. daily.  2. Tricor 48 mg p.o. daily.  3. Catapres patch TTS once weekly.  4. Lipitor 480 mg p.o. daily.  5. Bayer Aspirin 81 mg p.o. daily.  6. Lexapro 10 mg p.o. daily.  7. Namenda 10 mg p.o. b.i.d.  8. Aricept 10 mg p.o. q.h.s.   REVIEW OF SYSTEMS:  The patient has any chest pain and shortness of  problems with breathing.  No abdominal pains.  No problems with bowel or  bladder.  No joint pains.  No muscle aches.  No lower extremity  swelling.  Denies any headaches or any problems with her vision.  No  other neurologic complaints.   PHYSICAL EXAMINATION:  VITAL SIGNS:  Temperature is 96.7, pulse is 53-  60, respiratory rate 16, blood pressure 112/57.  GENERAL:  This is a pleasant, cooperative, elderly white female in no  acute distress.  HEENT:  Pupils equal and react to light accommodation.  Oropharynx is  clear.  Mucous membranes are moist.  HEART:  Regular rate and rhythm.  No murmurs, gallops or rubs.  LUNGS:  Clear to  auscultation bilaterally.  No rhonchi or wheezing.  ABDOMEN:  Soft, nontender, nondistended, positive bowel sounds.  EXTREMITIES:  No lower extremity edema.  MUSCULOSKELETAL:  The patient has 5/5 strength in all extremities.  Did  not exhibit any instability with walking.  Did observe her gait with a  walker, and she is doing remarkably well.  Rarely uses it for stability,  but uses it more for safety.  NEUROLOGIC:  The patient is alert and oriented to person and place,  somewhat to time.  She has significant memory deficit, but very pleasant  in nature.  Seems to be very happy in her current situation.  Gets along  well with staff as well as other residents.   LABORATORY DATA:  Lab work obtained showed a fasting lipid panel which  was total cholesterol 132, triglycerides 108, HDL 33, LDL 77.  Liver  profile was normal with a total protein 6.1, albumin 3.5.   IMPRESSION:  This is an 77 year old white female with Alzheimer's  dementia but very pleasant and having minimal medical complaints at this  time.   ASSESSMENT/PLAN:  1. Alzheimer's Dementia.  Will continue on the Aricept and Namenda,      appears to be working well.  The patient is sleeping well at night.      She initially had some depressive type symptoms but this is much      improved on Lexapro.  Will continue on that current regimen.  2. Hyperlipidemia/carotid artery disease/history of TIAs.  Her blood      pressure has been doing well and continue with the current regimen      of medications.  She is on Lipitor 80 and with the last fasting      lipid panel appears to be doing well.  Will continue on that as      well as the Tricor.  3. History of subdural hematoma.  She has not had any further falls.      She was on aspirin and Plavix at that time.  She is only currently      on the aspirin now, has not had any other symptoms related to TIAs.      Has not had any further adverse issues related to this subdural       hematoma which she had back in July 2008.  Will continue to monitor      routinely, but the patient overall is doing very well.      Catalina Pizza, M.D.  Electronically Signed     ZH/MEDQ  D:  08/23/2007  T:  08/23/2007  Job:  [id number]

## 2011-02-01 NOTE — Group Therapy Note (Signed)
NAME:  Madeline Alvarez, LAHAM NO.:  [id number]   MEDICAL RECORD NO.:  [id number]         PATIENT TYPE:  PORB   LOCATION:  S141                          FACILITY:  [facility]   PHYSICIAN:  Catalina Pizza, M.D.        DATE OF BIRTH:  08/08/1925   DATE OF PROCEDURE:  01/31/2008  DATE OF DISCHARGE:                                 PROGRESS NOTE   SUBJECTIVE:  [name] is an 76 year old white female resident of  [facility].  She did not have any complaints at this time,  feels like she is doing well, eating and drinking well, getting around,  and no complaints by the nurses.   OBJECTIVE:  VITAL SIGNS:  Temperature is 97.5, blood pressure is 142/78,  pulse is 62 and respiration 18.  GENERAL:  This is a pleasant white female sitting in a chair in no acute  distress.  HEENT:  Unremarkable.  HEART:  Regular rate and rhythm.  No murmurs, gallops or rubs.  LUNGS:  Clear to auscultation bilaterally.  No rhonchi or wheezing.  ABDOMEN:  Soft, nontender, nondistended, and positive bowel sounds.  EXTREMITIES:  No lower extremity edema.  MUSCULOSKELETAL:  Moving all extremities without difficulty, 5/5  strength.  NEUROLOGIC:  The patient is alert and oriented times x3, off a little  bit with time, but emotionally she appears to be doing well, conversant  and answering questions appropriately.   IMPRESSION:  An 77 year old white female with some dementia but appears  to be stable.   ASSESSMENT/PLAN:  1. Alzheimer dementia.  Continue with Aricept and Namenda.   1. Anxiety and depression.  Continue on Lexapro, doing well.   1. Hyperlipidemia, carotid artery disease, history of transient      ischemic attack.  Continue Lipitor and Tricor.   1. Hypertension.  Blood pressures doing better at this time.  We will      continue to monitor for this and make any changes to medicines if      needed.   DISPOSITION:  The patient continue current therapy and nursing to alert  if  anything needed routinely.      Catalina Pizza, M.D.  Electronically Signed    ZH/MEDQ  D:  01/31/2008  T:  02/01/2008  Job:  [id number]

## 2011-02-01 NOTE — Group Therapy Note (Signed)
NAME:  Madeline Alvarez, TIDD NO.:  [id number]   MEDICAL RECORD NO.:  [id number]          PATIENT TYPE:  ORB   LOCATION:  S141                          FACILITY:  [facility]   PHYSICIAN:  Catalina Pizza, M.D.        DATE OF BIRTH:  12/21/1924   DATE OF PROCEDURE:  09/18/2008  DATE OF DISCHARGE:                                 PROGRESS NOTE   SUBJECTIVE:  [name] is an 77 year old white female in her usual  state of health in the [facility].  She has no specific  complaints herself but staff did note that  she did get a little  agitated last night and did have some mild confusion which she has had  before, related to her Alzheimer dementia.  The patient has been eating,  and drinking, and ambulating without any difficulty.   PHYSICAL EXAMINATION:  VITAL SIGNS:  Temperature 97.8, blood pressure is  122/70, pulse is 70, respirations are 16.  GENERAL:  This is a pleasant white female sitting in chair, in no acute  distress.  HEENT:  Unremarkable.  CARDIOVASCULAR:  Regular rate and rhythm.  No murmur, gallops, or rubs.  LUNGS:  Clear to auscultation bilaterally.  ABDOMEN:  Soft, nontender, nondistended.  Positive bowel sounds.  EXTREMITIES:  There is no extremity edema.  NEUROLOGIC:  The patient is alert and oriented x3.  Does have more  forgetfulness, but is moving all the extremities, ambulating without  difficulty.  Gait is stable.   IMPRESSION:  An 77 year old white female with chronic mild Alzheimer  dementia by resident nursing facility.   ASSESSMENT/PLAN:  1. Alzheimer dementia.  Continue on Aricept and Namenda.  2. Anxiety and depression.  Continue on Lexapro.  I did write for more      Ativan as needed at night for possible sundowning and agitation and      insomnia.  It is only to be used in severe cases.  3. Hyperlipidemia.  Continue on Lipitor.  4. Coronary artery disease, history of transient ischemic attack.      Continue on the aspirin.  5.  Hypertension.  Previously, under good control of this problem.  We      will continue on current therapy.  This patient will continue to      follow up routine health issues.      Catalina Pizza, M.D.  Electronically Signed     ZH/MEDQ  D:  09/20/2008  T:  09/21/2008  Job:  [id number]

## 2011-02-01 NOTE — H&P (Signed)
NAME:  MUMTAZ, LOVINS NO.:  [id number]   MEDICAL RECORD NO.:  [id number]          PATIENT TYPE:  ORB   LOCATION:  S141                          FACILITY:  [facility]   PHYSICIAN:  Catalina Pizza, M.D.        DATE OF BIRTH:  1925/03/28   DATE OF ADMISSION:  DATE OF DISCHARGE:  LH                              HISTORY & PHYSICAL   This is a history and physical for [facility].   HISTORY OF PRESENT ILLNESS:  Ms. Humm is an 77 year old white  female in her usual state of health, resident at [facility]  with multiple medical problems as outlined below.  She denies any  problems at this time.  No pain, no issues.  The recently did speak with  her daughter who said that she had a cough and cold and letter was  written for her to keep her out of going to procedure at the [facility].   PAST MEDICAL HISTORY:  1. Significant for hypertension.  2. Alzheimer dementia.  3. History of transient ischemic attacks.  4. Carotid artery disease.  5. Dyslipidemia.  6. History of hysterectomy.  7. History of osteoporosis.  8. History of gastroesophageal reflux disease and peptic ulcer      disease.  9. History of subdural hematoma related to multiple falls.   MEDICATIONS:  1. The patient is on atenolol 50 mg once daily.  2. TriCor 48 mg once daily.  3. Catapres patch, change every week.  4. TTS 1 patch every week.  5. Lipitor 80 mg once daily.  6. Aspirin 81 mg once daily.  7. Lexapro 20 mg once daily.  8. Namenda 10 mg twice daily.  9. Aricept 10 mg once at night.  10.Allegra 180 mg p.o. daily p.r.n. for rash, itching.   ALLERGIES:  No known drug allergies.   SOCIAL HISTORY:  As mentioned above, she is a resident at [facility].  She is widowed, does not smoke or drink.   FAMILY HISTORY:  Noncontributory.   REVIEW OF SYSTEMS:  The patient denies any problems at this time.  All  review of systems is negative.   PHYSICAL EXAMINATION:  VITAL  SIGNS:  Temperature is 97.4, blood pressure  132/76, pulse 60, respirations are 16.  GENERAL:  This is a pleasant elderly female sitting in the chair in no  acute distress.  HEENT:  Unremarkable.  Pupils equal, round, and reactive to light and  accommodation.  NECK:  Supple.  HEART:  Regular rate and rhythm.  LUNGS:  Clear to auscultation bilaterally.  No rhonchi or wheezing.  ABDOMEN:  Mildly protuberant, but positive bowel sounds.  No  hepatosplenomegaly.  EXTREMITIES:  No lower extremity edema, moving all extremities without  difficulty.  Gait is slow delivered, not broad-based.  NEUROLOGIC:  The patient is alert and oriented to person and place.  She  does have signs of forgetfulness, unable to remember me from month to  month and asks multiple questions repeatedly after giving the answer.  She is very conversant and pleasant and knows a history of  long time  ago.   Not have any routine lab work noted at this time.  Recently, we will  recheck all of this.   IMPRESSION:  This is an 77 year old white female in relatively good  health who has chronic Alzheimer's dementia resident at nursing  facility.  All medical issues appear to be stable.   ASSESSMENT AND PLAN:  1. Alzheimer dementia.  Continue with Aricept and Namenda that appears      that is keeping her mentation stable.  2. Anxiety and depression, appears to be happy.  We will continue with      the Lexapro.  We will reduce those 10 mg and see if this changes      with her anxiety, depression, may need to go back up if continues      to have issues.  3. Hyperlipidemia.  We will check a routine fasting cholesterol with      the next blood work to see if any further adjustments any made to      her Lipitor or TriCor.  4. Carotid artery disease/history of transient ischemic attacks.      Continue on preventive measures including aspirin daily.  No      further interventions needed.  5. Hypertension.  Current on her current  regimen, appears to be doing      well.   DISPOSITION:  The patient is a DNR and we will continue on these wishes.  We will check routine lab work on her today and adjust medications as  needed.      Catalina Pizza, M.D.  Electronically Signed     ZH/MEDQ  D:  06/14/2008  T:  06/15/2008  Job:  [id number]

## 2011-02-01 NOTE — Group Therapy Note (Signed)
NAME:  Madeline Alvarez, Madeline Alvarez NO.:  [id number]   MEDICAL RECORD NO.:  [id number]          PATIENT TYPE:  ORB   LOCATION:  S141                          FACILITY:  [facility]   PHYSICIAN:  Catalina Pizza, M.D.        DATE OF BIRTH:  12-24-1924   DATE OF PROCEDURE:  01/08/2009  DATE OF DISCHARGE:                                 PROGRESS NOTE   SUBJECTIVE:  Ms. Suthers is an 77 year old white female resident of  [facility].  She has no complaints currently.  Of note, she has  had several episodes of lashing out at staff and residents.  Apparently,  she has been doing this routinely but has become more of an issue just  recently.  She was started on medications to help out.  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   OBJECTIVE:  VITAL SIGNS:  Temperature is 97.6, blood pressure 153/62,  pulse 58, respirations 20.  GENERAL:  This is a pleasant white female sitting in chair in no acute  distress.  HEENT:  Unremarkable.  CARDIOVASCULAR:  Regular rate and rhythm.  LUNGS:  Clear to auscultation bilaterally.  ABDOMEN:  Soft, mildly protuberant which is chronic, nontender, positive  bowel sounds.  EXTREMITIES:  No lower extremity edema.  NEUROLOGIC:  The patient is alert and oriented and answering questions  appropriately, actually knew my name when coming in the room.  She does  have some forgetfulness, but currently does not feel any anxiety or  depression-type symptoms.  Denies any specific issues related to  suicidal ideation, but she did just meet with the ACT team for this.   LABORATORY DATA:  She has urinary tract infection apparent on UA and has  been treated x2 for this, was started initially on Cipro but the  bacteria is not sensitive to it and was just transitioned  over to Septra  2 nights ago.  She has no symptoms related to this.   ASSESSMENT AND PLAN:  1. Urinary tract infection.  We will continue on the Septra for 1 week      therapy, tolerating the medications without any difficulty.  She is      asymptomatic with this but question whether this is contributing to      some of her agitation.  2. Agitation/dementia.  We will continue on all her medications.      Reviewed ACT team suggestions which would be increased for Lexapro      back up to 20 mg where it was before.  Use Xanax routinely and then      p.r.n. as well as increase her Risperdal up as well and agree with      all of these assessments.  3. Hypertension.  Blood pressure is up just a little bit today and      last a few times.  This is mildly elevated,  somehow it can be      related to her agitation.  4. Alzheimer dementia.  Continue on the Aricept and Namenda.  5. Coronary artery disease and history of transient ischemic attacks.      Continue on aspirin.  6. Hyperlipidemia.  Continue on Lipitor.   DISPOSITION:  I have discussed this with the daughter several weeks ago  and not feel this is a significant issue, although some of the staff at  [facility] who have been routinely redirecting her which she  has responded to before.  There are certain ones who felt threatened by  this as well as residents and made this ACT team referral which was  fine.  Family was notified of this and okay with this.  We will continue  to monitor for any other changes mentally, status wise, but medically  appears being stable.       Catalina Pizza, M.D.  Electronically Signed     ZH/MEDQ  D:  01/08/2009  T:  01/09/2009  Job:  [id number]

## 2011-02-01 NOTE — Group Therapy Note (Signed)
NAME:  Madeline Alvarez, Madeline Alvarez NO.:  [id number]   MEDICAL RECORD NO.:  [id number]          PATIENT TYPE:  ORB   LOCATION:  S141                          FACILITY:  [facility]   PHYSICIAN:  Catalina Pizza, M.D.        DATE OF BIRTH:  1925-05-11   DATE OF PROCEDURE:  05/18/2009  DATE OF DISCHARGE:                                 PROGRESS NOTE   SUBJECTIVE:  Madeline Alvarez is a 77 year old, white female resident at  [facility] does not have any complaints at this time.  She is  eating and drinking well.  Denies any pain.  Nursing notes that she has  not had as much agitation as before.   PHYSICAL EXAMINATION:  VITAL SIGNS:  Temperature is 97.1, blood pressure  is ranging from 134/68 up to 157/69, pulse 49, respirations 19.  GENERAL:  This is a pleasant, white female, sitting in chair, in no  acute distress.  HEENT:  Unremarkable.  CARDIOVASCULAR:  Regular rate and rhythm.  LUNGS:  Clear to auscultation bilaterally.  ABDOMEN:  Soft, nontender, nondistended, positive bowel sounds.  EXTREMITIES:  No lower extremity edema.  NEUROLOGIC:  The patient is alert and oriented to person, place.  Does  have some slow mentation which is at baseline and forgetfulness, but is  able to do many of the ADLs with only encouragement from staff.   Laboratory data obtained since last 2 months lipid profile showed total  cholesterol 118, LDL 62, triglycerides 159, HDL of 24.  Liver profile  was normal.  A1c was also checked for some reason that was 6.2 and her  BMET showed normal BUN 19, creatinine 0.87, glucose was mildly elevated  at 106.   IMPRESSION:  This is a 77 year old white female with minimal medical  issues currently only on chronic medicine for the treatment.   ASSESSMENT AND PLAN:  1. Elevated fasting blood glucose and impaired glucose tolerance.  She      does have A1c is 6.2, so we will try to limit her sugary sweets,      but we will recheck again in another month and see if  this is      trending up to see if need to be a little more aggressive with her      sugars.  She is not currently on any therapy for this.  2. Hypertension.  Blood pressure have been running up just slightly.      She does have slow pulse, which is chronic for her, so I do not      want to add any other additional meds that could cause worsening      problems with slowing her heart rate down.  3. Alzheimer dementia.  Continue on her current therapy with Aricept,      Namenda.  4. Hyperlipidemia.  Her sugars are doing well on her current therapy.      We will continue on Lipitor.  5. Anxiety, depression.  We will continue on her current medication.      She appears to be relatively stable currently on exam.  CURRENT MEDICATIONS:  1. She is on Lexapro 20 mg once daily.  2. Aspirin 81 mg once daily.  3. Lipitor 80 mg once daily.  4. Tricor 40 mg once daily.  5. Tenormin 50 mg once daily.  6. Risperdal 0.25 mg b.i.d.  7. Namenda 10 mg b.i.d.  8. Aricept 10 mg once daily.  9. Xanax 0.25 mg 1 tablet b.i.d. as needed for anxiety.  10.Allegra 180 mg as needed for rash or itching.  11.Milk of magnesia for constipation.   We will continue to monitor her blood pressure and sugars and may need  to start on a low-dose of blood pressure medicines and another blood  pressure medicine and reassess her blood pressure control.      Catalina Pizza, M.D.     ZH/MEDQ  D:  05/18/2009  T:  05/19/2009  Job:  [id number]

## 2011-02-04 NOTE — Discharge Summary (Signed)
NAME:  Madeline Alvarez, Madeline Alvarez                       ACCOUNT NO.:  [id number]   MEDICAL RECORD NO.:  [id number]                   PATIENT TYPE:  INP   LOCATION:  5524                                 FACILITY:  [facility]   PHYSICIAN:  Larina Earthly, M.D.                     DATE OF BIRTH:  03/31/1925   DATE OF ADMISSION:  08/21/2003  DATE OF DISCHARGE:  08/24/2003                                 DISCHARGE SUMMARY   DISCHARGE DIAGNOSES:  1. Resolving left MCA, left PCA stroke, possible embolic in etiology with     clinical symptomatology resolved.  2. Hypertension.  3. Hyperlipidemia, uncontrolled on Lipitor 10 mg p.o. daily.  4. Stress reaction with anxiety disorder.   SECONDARY DIAGNOSES:  1. Osteoporosis.  2. Gastroesophageal reflux disease.  3. Peptic ulcer disease with history of hiatal hernia dating back to the     1980s.  4. History of esophageal dilatation in 1998.  5. History of left breast benign surgery.  6. History of hysterectomy, oophorectomy, and appendectomy 1971.   DISCHARGE MEDICATIONS:  1. Benicar 40 mg half tablet p.o. daily.  2. Prilosec 20 mg p.o. daily.  3. Hydrochlorothiazide 50 mg p.o. daily.  4. Potassium chloride 20 mEq p.o. b.i.d.  5. Miacalcin nasal spray one spray daily alternating nostrils daily.  6. Tums two tablets daily.  7. Vitamin D 400 units daily.  8. Lipitor 40 mg half of an 80 mg tablet p.o. daily.  9. Enteric-coated aspirin 325 mg p.o. daily.  10.      Multivitamin daily.  11.      Xanax 0.25 mg q.6h. p.r.n. for stress for sleep or anxiety.     Prescription given for 40 with three refills.   LABORATORY DATA:  White blood cell count 6.5, hemoglobin 11.9, hematocrit  35.4%, platelet count 221,000.  Sodium 142, potassium 3.9, BUN 19,  creatinine 0.9, carbon dioxide 28, glucose 107.  AST 18, ALT 14, alkaline  phosphatase 66, total bilirubin 0.4.  Additional laboratories:  Hemoglobin  A1C 6.2%.  PT 12.7 seconds.  Total protein 6.7, albumin 3.5.   Homocysteine  9.47, within the normal range.  Total cholesterol 173, triglycerides 282,  HDL cholesterol 32, LDL cholesterol 85.  TSH 4.202.  RPR negative.  MRI of  the brain reveals findings compatible with multiple sites of infarction  involving the left cerebral hemisphere, specifically, the left frontal and  parietal lobes in the posterior aspect of the left occipital lobe mostly  involving the left MCA territory and the left PCA distribution suggestive of  an embolic source.  MRA reveals findings compatible with intracranial  atherosclerotic disease of multifocal stenosis.  Most impressive site of  disease is the right MCA including M1 and trifurcation segments.  Transcranial Dopplers are pending with evaluation by Pramod P. Pearlean Brownie, MD at  follow-up visit.  Two view chest x-ray on August 21, 2003 reveals  cardiomegaly  without evidence of acute cardiopulmonary disease.  2-D  echocardiogram was performed and official results reveal that overall left  ventricular systolic function was mildly decreased.  Left ventricular  ejection fraction was estimated in the range of 40-50% with mild diffuse  left ventricular hypokinesis.  EKG reveals sinus bradycardia, otherwise  normal.   HISTORY OF PRESENT ILLNESS:  This is a 77 year old Caucasian female who  awoke on the morning of admission with numbness and a feeling of weakness in  her right hand and forearm.  She originally thought that she just slipped on  her arm in the wrong position.  However, her symptomatology persisted and  she presented to the emergency room where she was felt to have experienced  cerebrovascular accident.  She was subsequently admitted to a telemetry bed.   HOSPITAL COURSE:  The patient was seen by neurology as well as my partner,  Loraine Leriche A. Perini, M.D. on admission.  She was given clonidine p.r.n. dosing  for hypertension in addition to her home medications including an ARB.  She  was ambulated to prevent DVT given  the minimal nature of her symptoms which  quickly resolved in the 24-hour period of time.  She did not need any  extensive assistance from occupational or physical therapy.  Premarin was  held.  Aspirin was continued on a daily basis along with a proton pump  inhibitor.  She had the above mentioned radiological evaluations with  transcranial Doppler pending.   The patient did have significant stressors involving her acute stroke as  well as family issues.  Lexapro was initiated during hospitalization.  However, after extensive discussion with both the patient and family prior  to discharge this was discontinued to be reconsidered after discharge in my  office.  The patient will be discharged on Xanax to help her with acute  anxiety episodes as well as poor sleep.  Again, Lexapro may be readded  within the next two to three weeks depending her outcome at home.  Blood  pressure remained controlled for the most part except during episodes of  anxiety.  Dose escalation of her Lipitor given her abnormal lipid profile  was discussed with family including the possible need to add TriCor and/or  Niaspan in the future pending further lipid panel results in my office.  Emphasis was placed on discontinuation of Premarin and taking 325 mg of  aspirin on a daily basis prior to discharge.                                                Larina Earthly, M.D.    RA/MEDQ  D:  08/24/2003  T:  08/25/2003  Job:  [id number]   cc:   Pramod P. Pearlean Brownie, MD  Fax: [phone]

## 2011-02-04 NOTE — H&P (Signed)
NAME:  Madeline Alvarez, Madeline Alvarez                       ACCOUNT NO.:  [id number]   MEDICAL RECORD NO.:  [id number]                   PATIENT TYPE:  INP   LOCATION:  1825                                 FACILITY:  [facility]   PHYSICIAN:  Larina Earthly, M.D.                     DATE OF BIRTH:  1925-09-06   DATE OF ADMISSION:  08/21/2003  DATE OF DISCHARGE:                                HISTORY & PHYSICAL   CHIEF COMPLAINT:  Right arm and hand numbness.   HISTORY OF PRESENT ILLNESS:  [name] is a pleasant 77 year old female  who awoke this morning with numbness and a feeling of weakness in her right  hand and forearm.  At first she felt as though she had just slept on her  arm; however, the feeling persisted throughout the day.  She eventually  presented to the emergency room.  She reports no other neurologic signs or  symptoms.  She does have some objective findings of clumsiness and weakness  of the right hand and still has a perception of numbness in the right hand  and forearm.  She will be admitted for further evaluation and treatment.   PAST MEDICAL HISTORY:  1. Gastroesophageal reflux disease.  2. Hypertension for over 20 years.  3. Hyperlipidemia.  4. Osteoporosis.  5. Peptic ulcer disease and history of hiatal hernia dating back to the     1980s.  6. Esophageal dilatation in 1998.  7. Left breast benign surgery.  8. History of hysterectomy, oophorectomy, and appendectomy in 1971.   No known cardiac history.   ALLERGIES:  1. AUGMENTIN.  2. CODEINE.  3. LESCOL.   MEDICATIONS:  In the emergency room, she has been given clonidine 0.1 mg  twice.   HOME MEDICATIONS:  1. Benicar 40 mg 1/2 tablet daily.  2. Prilosec 20 mg once daily.  3. HCTZ 50 mg once daily.  4. K-Dur 20 mEq twice daily.  5. Miacalcin nose spray 1 spray daily, alternate nostrils daily.  6. Premarin 0.625 mg daily.  7. Tums 2 tablets daily.  8. Lipitor 10 mg q.hs.  9. Vitamin D 400 units daily.  10.       Multivitamin daily.   She does not take aspirin regularly.  She does use a rare Anacin tablet.  She did take an 81 mg aspirin today.   SOCIAL HISTORY:  She is married.  Her husband's name is Optometrist.  They have  been married for 25 years.  She has one daughter named Madeline Alvarez, who is  at her bedside.  There is no alcohol.  No drugs.  No tobacco use history.  She is retired.   FAMILY HISTORY:  Father died of stroke at age 85.  Mother died of  questionable causes.   REVIEW OF SYSTEMS:  Patient had been feeling in her usual state of health  lately.  She does wear a  magnetic pad over her low back for some low back  pain.  She states that her right hand symptoms are a little better now than  they were earlier in the day but are still present.  She denies any recent  fevers.  She did have a flu shot this fall.  She denies shortness of breath  or chest pain.  She does have some dependent edema occasionally but none  recently.  She denies any nausea, vomiting, or diarrhea, but she does  occasionally have two soft stools a day.  She denies any visual symptoms,  specifically no visual field defects or diplopia noted.  She denies any  right leg weakness or symptoms.  She denies any dysarthria, dysphagia, or  dysphasia.   PHYSICAL EXAMINATION:  VITAL SIGNS:  Temperature 98.0, blood pressure  218/97, which has come down some with clonidine dosing, pulse 64,  respiratory rate 20.  GENERAL:  She is in no acute distress.  She is alert and oriented x 4.  HEENT:  Normocephalic and atraumatic.  Pupils are equal, round and reactive  to light.  Extraocular movements are intact.  No icterus.  Oropharynx is  clear.  NECK:  Carotids are 2+ with no bruits.  No JVD.  LUNGS:  Clear to auscultation bilaterally with no rales, rhonchi or wheezes.  HEART:  Regular rate and rhythm with no murmur, rub or gallop.  ABDOMEN:  Normoactive bowel sounds.  Soft, nontender, nondistended with no  mass or  hepatosplenomegaly.  EXTREMITIES:  No edema.  There are 1+ distal pulses, 2+ proximal pulses, 2+  pulses in both radial arteries.  Right grip strength is a little bit weak,  compared to the left.  There is no pronator drift but with this maneuver,  she does drop her right arm some, demonstrating some mild weakness.  She has  equivocal Babinski signs of both toes.  She has 2+ deep tendon reflexes of  both upper extremities and 1+ of both lower extremities.  The skin on her  right hand and forearm is somewhat cool to touch.  Finger/nose/finger  testing is intact, but she is clumsy with her right hand.   LABORATORY DATA:  Cranial CT scan shows white matter small vessel disease.  A right basal ganglia lacune of questionable age.  No acute stroke or  bleeding noted.   EKG reveals a normal sinus rhythm with a rate of 57, otherwise normal.   INR 0.9, sodium 138, potassium 3.9, chloride 105, CO2 25, BUN 19, creatinine  0.7, glucose 95.  LFTs are normal.  White count 8.1, hemoglobin 12.5,  platelet count 224,000, 63% segs, 29% lymphocytes, 6% monocytes.   ASSESSMENT/PLAN:  A 77 year old female with acute left brain cerebrovascular  accident.  Will admit to a telemetry bed.  Will obtain blood pressure  control with her home medications and p.r.n. clonidine dosing.  Will obtain  an occupational therapy consult for her right hand weakness and numbness.  She is to be up as tolerated and ambulate daily to prevent DVT.  We will  hold and possibly discontinue Premarin per her primary care physician.  We will continue her other home medications.  We will add aspirin daily and  continue on proton pump inhibitor therapy.  I will defer imaging and  diagnostic orders to the pending neurology consultation.  Patient is a full  code status.      Mark A. Perini, M.D.  Larina Earthly, M.D.   MAP/MEDQ  D:  08/21/2003  T:  08/21/2003  Job:  [id number]   cc:   Casimiro Needle L. Thad Ranger, M.D.  [address]  [address]  [place]  [place] [address]  Fax: [phone]   Vania Rea. Jarold Motto, M.D. [facility]

## 2011-02-04 NOTE — Consult Note (Signed)
NAME:  Madeline Alvarez, BUENING                       ACCOUNT NO.:  [id number]   MEDICAL RECORD NO.:  [id number]                   PATIENT TYPE:  INP   LOCATION:  5524                                 FACILITY:  [facility]   PHYSICIAN:  Casimiro Needle L. Thad Ranger, M.D.           DATE OF BIRTH:  07/21/1925   DATE OF CONSULTATION:  08/21/2003  DATE OF DISCHARGE:                                   CONSULTATION   REASON FOR EVALUATION:  Suspected stroke.   HISTORY OF PRESENT ILLNESS:  This is the initial inpatient consultation  evaluation for this 77 year old woman with past medical history including  hypertension and hyperlipidemia who awoke this morning with numb sensation  in her right upper extremity.  She noted that upon awakening that she had  slept on the extremity and noticed as the morning went on that the hand was  clumsy and she was unable to do things such as comb her hair.  She felt that  the hand was asleep, but sought medical attention a few hours later after  symptoms did not clear.  She presented to the emergency room and  subsequently admitted for a presumed stroke event.  She denies ever having  anything like this before.  This were no associated symptoms involving the  head, face or leg and she denies any neck pain, chest pain, shortness of  breath or palpitations.   PAST MEDICAL HISTORY:  Remarkable for hypertension under fair control as  well as hyperlipidemia and peripheral vascular disease.  She denies any  history of cardiac problems.   FAMILY HISTORY/SOCIAL HISTORY/REVIEW OF SYSTEMS:  As outlined in the  admission H&P by Dr. Waynard Edwards.   MEDICATIONS:  Prior to admission, she was taking:  1. Prilosec.  2. HCTZ.  3. Potassium.  4. Micardis.  5. Premarin.  6. Tums.  7. Lipitor.  8. Vitamin D.  9. Multivitamin.  10.      Atenolol.   She was not on any antiplatelet therapy.   ALLERGIES:  1. AUGMENTIN.  2. CODEINE.  3. LESCOL.   PHYSICAL EXAMINATION:  VITAL SIGNS:   Temperature 98.0, blood pressure  146/64, pulse 55, respirations 20.  GENERAL:  This is an elderly, but healthy-appearing female in no evident  distress.  HEENT:  Cranium is normocephalic and atraumatic.  Oropharynx is benign.  NECK:  Suppl without carotid bruits.  HEART:  Regular  rate and rhythm without murmurs.  NEUROLOGIC:  Mental status:  She is awake, alert and fully oriented to time,  place and person.  Recent and remote memory are intact.  Attention span,  concentration, fund of knowledge are appropriate.  Speech is fluent and not  dysarthric.  There are no defects to confrontational naming and she can  repeat phrase.  Mood is euthymic and affect appropriate.  Cranial nerves:  Funduscopic exam is benign.  Pupils equal and briskly reactive.  Extraocular  movements are full without nystagmus.  Visual  fields full to confrontation.  Hearing is intact and symmetric to finger rub.  The patient's was intact to  pinprick.  Face, tongue, and palate move normally and symmetrically.  Shoulder shrub strength is normal.  Motor sensory:  Normal bulk and tone.  There is mild to moderate weakness in the muscles of the hand and fingers  involving the distribution of all three nerves.  Otherwise, strength is  normal throughout.  Sensation:  She reports mildly diminished pinprick  sensation on the right hand compared to the left.  Otherwise, intact to  light tough and pinprick throughout.  Coordination:  Rapid movements are  performed slowly on the right compared to the left.  Finger-to-nose is  performed adequately.  Gait:  She arises from the bed easily and her stance  is normal.  She is able to ambulate without difficulty and can toe walk.  Reflexes:  1+ and symmetric.  Toes are downgoing.   LABORATORY DATA:  CBC:  White count 8.1, hemoglobin 12.5, platelets 224,000  with a normal differential.  CMET is normal.  Prothrombin time is 12.7  seconds with INR 0.9.  EKG demonstrates sinus bradycardia,  otherwise normal.  CT of the head performed in the emergency room  is personally reviewed and  it demonstrates white matter small vessel disease with an old lacunar  infarct in the right putamen, but no evidence of acute finding.   IMPRESSION:  Acute right upper extremity weakness, clumsiness and sensory  changes. This is not in the distribution of a single peripheral nerve and is  most likely due to a left brain subcortical infarct.  Risk factors include  age, family history, hypertension, hyperlipidemia.   PLAN:  Proceed with routine stroke workup including MRI, MRA, carotid and  transcranial Dopplers, 2-D echocardiogram.  Fasting lipids and homocystine  are pending.  Stroke service will follow in the morning.                                               Michael L. Thad Ranger, M.D.    MLR/MEDQ  D:  08/21/2003  T:  08/22/2003  Job:  [id number]

## 2011-07-05 LAB — BASIC METABOLIC PANEL
BUN: 18
BUN: 21
CO2: 27
CO2: 27
Calcium: 8.5
Calcium: 8.6
Chloride: 106
Creatinine, Ser: 0.67
GFR calc Af Amer: >60
GFR calc non Af Amer: >60
Glucose, Bld: 135 — ABNORMAL HIGH
Glucose, Bld: 102 — ABNORMAL HIGH
Potassium: 4.3
Sodium: 139
Sodium: 141

## 2011-07-05 LAB — CLOSTRIDIUM DIFFICILE EIA: C difficile Toxins A+B, EIA: NEGATIVE

## 2011-07-05 LAB — URINALYSIS, ROUTINE W REFLEX MICROSCOPIC
Bilirubin Urine: NEGATIVE
Leukocytes, UA: NEGATIVE
Nitrite: POSITIVE — AB
Specific Gravity, Urine: 1.02
Urobilinogen, UA: 0.2

## 2011-07-05 LAB — GIARDIA/CRYPTOSPORIDIUM SCREEN(EIA): Cryptosporidium Screen (EIA): NEGATIVE

## 2011-07-05 LAB — CBC
HCT: 35 — ABNORMAL LOW
Hemoglobin: 11.6 — ABNORMAL LOW
MCHC: 33.2
Platelets: 188
RDW: 14

## 2011-07-05 LAB — PROTIME-INR
INR: 1.1
Prothrombin Time: 14

## 2011-07-05 LAB — URINE MICROSCOPIC-ADD ON

## 2011-07-05 LAB — DIFFERENTIAL
Basophils Absolute: 0
Basophils Relative: 0
Eosinophils Relative: 1
Monocytes Absolute: 0.5
Neutro Abs: 6

## 2011-07-05 LAB — HEPATIC FUNCTION PANEL
ALT: 15
Alkaline Phosphatase: 98
Bilirubin, Direct: 0.1
Indirect Bilirubin: 0.7

## 2011-07-05 LAB — APTT: aPTT: 24

## 2011-09-23 ENCOUNTER — Ambulatory Visit (HOSPITAL_COMMUNITY)
Admit: 2011-09-23 | Discharge: 2011-09-23 | Disposition: A | Discharge status: Skilled Nursing Facility | Payer: Medicare Other | Source: Skilled Nursing Facility | Attending: Internal Medicine | Admitting: Internal Medicine

## 2011-09-23 DIAGNOSIS — R05 Cough: Secondary | ICD-10-CM | POA: Insufficient documentation

## 2011-09-23 DIAGNOSIS — R0989 Other specified symptoms and signs involving the circulatory and respiratory systems: Secondary | ICD-10-CM | POA: Insufficient documentation

## 2011-09-23 DIAGNOSIS — R059 Cough, unspecified: Secondary | ICD-10-CM | POA: Insufficient documentation

## 2011-12-07 ENCOUNTER — Emergency Department (HOSPITAL_COMMUNITY)
Admission: EM | Admit: 2011-12-07 | Discharge: 2011-12-07 | Disposition: A | Discharge status: Skilled Nursing Facility | Payer: Medicare Other | Attending: Emergency Medicine | Admitting: Emergency Medicine

## 2011-12-07 ENCOUNTER — Inpatient Hospital Stay: Admission: RE | Admit: 2011-12-07 | Payer: Medicare Other | Source: Ambulatory Visit | Admitting: Internal Medicine

## 2011-12-07 ENCOUNTER — Encounter (HOSPITAL_COMMUNITY): Payer: Self-pay | Admitting: Emergency Medicine

## 2011-12-07 ENCOUNTER — Inpatient Hospital Stay
Admission: AD | Admit: 2011-12-07 | Discharge: 2015-11-18 | Disposition: E | Payer: Medicare Other | Source: Ambulatory Visit | Attending: Internal Medicine | Admitting: Internal Medicine

## 2011-12-07 ENCOUNTER — Other Ambulatory Visit: Payer: Self-pay

## 2011-12-07 DIAGNOSIS — R05 Cough: Principal | ICD-10-CM

## 2011-12-07 DIAGNOSIS — F29 Unspecified psychosis not due to a substance or known physiological condition: Secondary | ICD-10-CM | POA: Insufficient documentation

## 2011-12-07 DIAGNOSIS — G309 Alzheimer's disease, unspecified: Secondary | ICD-10-CM | POA: Insufficient documentation

## 2011-12-07 DIAGNOSIS — F0280 Dementia in other diseases classified elsewhere without behavioral disturbance: Secondary | ICD-10-CM | POA: Insufficient documentation

## 2011-12-07 DIAGNOSIS — F0390 Unspecified dementia without behavioral disturbance: Secondary | ICD-10-CM | POA: Insufficient documentation

## 2011-12-07 DIAGNOSIS — R55 Syncope and collapse: Secondary | ICD-10-CM | POA: Insufficient documentation

## 2011-12-07 DIAGNOSIS — R4182 Altered mental status, unspecified: Secondary | ICD-10-CM | POA: Insufficient documentation

## 2011-12-07 DIAGNOSIS — N390 Urinary tract infection, site not specified: Secondary | ICD-10-CM

## 2011-12-07 DIAGNOSIS — F079 Unspecified personality and behavioral disorder due to known physiological condition: Secondary | ICD-10-CM | POA: Insufficient documentation

## 2011-12-07 DIAGNOSIS — K219 Gastro-esophageal reflux disease without esophagitis: Secondary | ICD-10-CM | POA: Insufficient documentation

## 2011-12-07 DIAGNOSIS — I10 Essential (primary) hypertension: Secondary | ICD-10-CM | POA: Insufficient documentation

## 2011-12-07 DIAGNOSIS — R0602 Shortness of breath: Secondary | ICD-10-CM

## 2011-12-07 HISTORY — DX: Unspecified symbolic dysfunctions: R48.9

## 2011-12-07 HISTORY — DX: Unspecified lack of coordination: R27.9

## 2011-12-07 HISTORY — DX: Unspecified mental disorder due to known physiological condition: F09

## 2011-12-07 HISTORY — DX: Traumatic subdural hemorrhage with loss of consciousness status unknown, initial encounter: S06.5XAA

## 2011-12-07 HISTORY — DX: Essential (primary) hypertension: I10

## 2011-12-07 HISTORY — DX: Difficulty in walking, not elsewhere classified: R26.2

## 2011-12-07 HISTORY — DX: Muscle weakness (generalized): M62.81

## 2011-12-07 HISTORY — DX: Traumatic subdural hemorrhage with loss of consciousness of unspecified duration, initial encounter: S06.5X9A

## 2011-12-07 LAB — URINALYSIS, ROUTINE W REFLEX MICROSCOPIC
Bilirubin Urine: NEGATIVE
Ketones, ur: NEGATIVE mg/dL
Nitrite: POSITIVE — AB
Protein, ur: NEGATIVE mg/dL
Urobilinogen, UA: 0.2 mg/dL (ref 0.0–1.0)

## 2011-12-07 LAB — POCT I-STAT, CHEM 8
Calcium, Ion: 1.19 mmol/L (ref 1.12–1.32)
Chloride: 108 mEq/L (ref 96–112)
Glucose, Bld: 129 mg/dL — ABNORMAL HIGH (ref 70–99)
HCT: 38 % (ref 36.0–46.0)
Hemoglobin: 12.9 g/dL (ref 12.0–15.0)

## 2011-12-07 MED ORDER — CIPROFLOXACIN HCL 250 MG PO TABS
500.0000 mg | ORAL_TABLET | Freq: Once | ORAL | Status: AC
  Administered 2011-12-07: 500 mg via ORAL
  Filled 2011-12-07: qty 2

## 2011-12-07 MED ORDER — CIPROFLOXACIN HCL 500 MG PO TABS: 500.0000 mg | ORAL_TABLET | Freq: Two times a day (BID) | ORAL | Status: AC

## 2011-12-07 NOTE — Discharge Instructions (Signed)
Your tests are normal except for you urine test which shows infection - we have started Ciprofloxacin for this infection - take it twice daily for 7 days, return to the ER for severe or worsening symptoms including fainting spells, high fevers or vomiting.

## 2011-12-07 NOTE — ED Notes (Signed)
Patient sent from [facility] for syncopal episode post bowel movement. Patient has history of Alzheimers and brain injury, nursing home stated patient is in  normal state now. Patient has generalized weakness.

## 2011-12-07 NOTE — ED Provider Notes (Signed)
History     CSN: [id number]  Arrival date & time 12/17/2011  0600   First MD Initiated Contact with Patient 11/27/2011 [phone]      Chief Complaint  Patient presents with  . Altered Mental Status  . Loss of Consciousness    (Consider location/radiation/quality/duration/timing/severity/associated sxs/prior treatment) HPI Comments: 78 year old female with a history of dementia, GERD, Alzheimer's disease and hypertension who presents with a complaint of syncopal episode. According to the nursing home report the patient was having a bowel movement with the staff members present, had a brief syncopal episode and had a slight confusion that lasted for several minutes followed by complete resolution back to her normal mental status. At this time the patient has no complaints, and was sent for evaluation by the nursing facility. There was no complaints of any problems prior to this including no chest pain palpitations fevers vomiting diarrhea rectal bleeding or rashes.  Patient is a 78 y.o. female presenting with altered mental status and syncope. The history is provided by the EMS personnel and the nursing home. The history is limited by the condition of the patient (Dementia).  Altered Mental Status  Loss of Consciousness    Past Medical History  Diagnosis Date  . Subdural hematoma, post-traumatic   . Hypertension   . Organic brain syndrome   . Muscle weakness (generalized)   . Difficulty walking   . Symbolic dysfunction, unspecified   . Lack of coordination     Past Surgical History  Procedure Date  . Brain surgery     History reviewed. No pertinent family history.  History  Substance Use Topics  . Smoking status: Unknown If Ever Smoked  . Smokeless tobacco: Not on file  . Alcohol Use: No    OB History    Grav Para Term Preterm Abortions TAB SAB Ect Mult Living                  Review of Systems  Unable to perform ROS: Dementia  Cardiovascular: Positive for syncope.    Psychiatric/Behavioral: Positive for altered mental status.    Allergies  Augmentin; Codeine; Fluvastatin; and Lescol  Home Medications   Current Outpatient Rx  Name Route Sig Dispense Refill  . CLONIDINE HCL 0.1 MG/24HR TD PTWK Transdermal Place 1 patch onto the skin once a week.    . ALPRAZOLAM 0.25 MG PO TABS Oral Take 0.125 mg by mouth 2 (two) times daily as needed.    . ASPIRIN 81 MG PO TABS Oral Take 81 mg by mouth daily.    . ATENOLOL 25 MG PO TABS Oral Take 25 mg by mouth daily.    . ATORVASTATIN CALCIUM 80 MG PO TABS Oral Take 80 mg by mouth daily.    Marland Kitchen CIPROFLOXACIN HCL 500 MG PO TABS Oral Take 1 tablet (500 mg total) by mouth 2 (two) times daily. 14 tablet 0  . DIVALPROEX SODIUM 125 MG PO CPSP Oral Take 125 mg by mouth 2 (two) times daily.    . DONEPEZIL HCL 10 MG PO TABS Oral Take 10 mg by mouth at bedtime.    Marland Kitchen ESCITALOPRAM OXALATE 20 MG PO TABS Oral Take 20 mg by mouth daily.    Marland Kitchen PRO-STAT 64 PO LIQD Oral Take 30 mLs by mouth daily.    . FENOFIBRATE 48 MG PO TABS Oral Take 48 mg by mouth daily.    . GUAIFENESIN ER 600 MG PO TB12 Oral Take 1,200 mg by mouth 2 (two) times daily as  needed.    Marland Kitchen LISINOPRIL 20 MG PO TABS Oral Take 20 mg by mouth daily.    Marland Kitchen MAGNESIUM HYDROXIDE 400 MG/5ML PO SUSP Oral Take 30 mLs by mouth daily as needed.    Marland Kitchen MEMANTINE HCL 10 MG PO TABS Oral Take 10 mg by mouth 2 (two) times daily.      BP 163/66  Pulse 79  Temp(Src) 98.1 F (36.7 C) (Oral)  Resp 18  Ht 5\' 6"  (1.676 m)  Wt 170 lb (77.111 kg)  BMI 27.44 kg/m2  SpO2 100%  Physical Exam  Nursing note and vitals reviewed. Constitutional: She appears well-developed and well-nourished. No distress.  HENT:  Head: Normocephalic and atraumatic.  Mouth/Throat: No oropharyngeal exudate.       Mouth breather, dry mucous membranes  Eyes: Conjunctivae and EOM are normal. Pupils are equal, round, and reactive to light. Right eye exhibits no discharge. Left eye exhibits no discharge. No scleral  icterus.  Neck: Normal range of motion. Neck supple. No JVD present. No thyromegaly present.  Cardiovascular: Normal rate, regular rhythm, normal heart sounds and intact distal pulses.  Exam reveals no gallop and no friction rub.   No murmur heard. Pulmonary/Chest: Effort normal and breath sounds normal. No respiratory distress. She has no wheezes. She has no rales.  Abdominal: Soft. Bowel sounds are normal. She exhibits no distension and no mass. There is no tenderness.  Musculoskeletal: Normal range of motion. She exhibits no edema and no tenderness.  Lymphadenopathy:    She has no cervical adenopathy.  Neurological: She is alert. Coordination normal.       Patient is able to communicate, speech in full sentences, follows commands without difficulty, normal strength of the upper and lower extremities.  Able to lift leg to 90 with normal strength bilaterally. Pupillary exam normal bilaterally, extraocular movements normal bilaterally, speech is clear, patient is oriented to date  Skin: Skin is warm and dry. No rash noted. No erythema.  Psychiatric: She has a normal mood and affect. Her behavior is normal.    ED Course  Procedures (including critical care time)  ED ECG REPORT   Date: December 23, 2011   Rate: 73  Rhythm: normal sinus rhythm  QRS Axis: normal  Intervals: normal  ST/T Wave abnormalities: nonspecific T wave changes  Conduction Disutrbances:none  Narrative Interpretation:   Old EKG Reviewed: no change from prior ECG from 09/21/09   Labs Reviewed  URINALYSIS, ROUTINE W REFLEX MICROSCOPIC - Abnormal; Notable for the following:    APPearance HAZY (*)    Hgb urine dipstick LARGE (*)    Nitrite POSITIVE (*)    Leukocytes, UA TRACE (*)    All other components within normal limits  URINE MICROSCOPIC-ADD ON - Abnormal; Notable for the following:    Bacteria, UA MANY (*)    All other components within normal limits  POCT I-STAT, CHEM 8 - Abnormal; Notable for the following:     Glucose, Bld 129 (*)    All other components within normal limits  URINE CULTURE   No results found.   1. UTI (lower urinary tract infection)   2. Vaso vagal episode       MDM  Patient has normal vital signs other than mild hypertension, has normal cardiac, pulmonary and neurologic exam for her baseline. We'll check EKG, urinalysis and an i-STAT chemistry with hemoglobin to rule out other sources of possible syncope. Also a vasovagal or dehydration.  Review of the medical records shows that the patient has  been in the [facility] over the last year, has been fairly well though has a history of significant Alzheimer's dementia.  Laboratory review: normal istat chemistry, UA with nit + and many bactyeria (cath specimen)  EKG shows no changes:   Cipro given, home on same, no other signs of pathology on today's exam and lab w/u - in NH with good supervision - f/u in 1-2 days       Vida Roller, MD 11/29/2011 [phone]

## 2011-12-07 NOTE — ED Notes (Signed)
[facility] to come and pick pt up to transport back.  Reports will send someone as soon as they can.

## 2011-12-11 LAB — URINE CULTURE: Culture  Setup Time: 201303201025

## 2011-12-12 NOTE — ED Notes (Signed)
+   Urine. Patient given Cipro. Resistant to Cipro. Chart sent to EDP office for review.

## 2011-12-13 NOTE — ED Notes (Signed)
Chart back from EDP office.Jeanene Erb and left message for pt's nurse to call

## 2011-12-14 NOTE — ED Notes (Addendum)
Chart returned from EDP office. If augmentin allergy is not severe-> Keflex 500 mg 3 x daily x 7 days. Follow-up with PCP to ensure resolution. Prescribed by Rhea Bleacher PA-C. Attempted to call patient. No answer.

## 2011-12-16 NOTE — ED Notes (Signed)
Pt a resident of [facility] per daughter.  Cx results faxed to [phone] Attn [place].

## 2011-12-19 DEATH — deceased

## 2013-02-12 ENCOUNTER — Ambulatory Visit (HOSPITAL_COMMUNITY)
Admission: RE | Admit: 2013-02-12 | Discharge: 2013-02-12 | Disposition: A | Discharge status: Home / Self Care | Payer: Medicare Other | Source: Ambulatory Visit | Attending: Internal Medicine | Admitting: Internal Medicine

## 2013-02-12 ENCOUNTER — Other Ambulatory Visit (HOSPITAL_COMMUNITY): Payer: Self-pay | Admitting: Internal Medicine

## 2013-02-12 DIAGNOSIS — R05 Cough: Secondary | ICD-10-CM

## 2013-02-12 DIAGNOSIS — J3489 Other specified disorders of nose and nasal sinuses: Secondary | ICD-10-CM | POA: Insufficient documentation

## 2013-02-12 DIAGNOSIS — R059 Cough, unspecified: Secondary | ICD-10-CM | POA: Insufficient documentation

## 2013-09-24 ENCOUNTER — Ambulatory Visit (HOSPITAL_COMMUNITY): Payer: Medicare Other | Attending: Internal Medicine

## 2013-09-24 DIAGNOSIS — R059 Cough, unspecified: Secondary | ICD-10-CM | POA: Insufficient documentation

## 2013-09-24 DIAGNOSIS — I700 Atherosclerosis of aorta: Secondary | ICD-10-CM | POA: Insufficient documentation

## 2013-09-24 DIAGNOSIS — R05 Cough: Secondary | ICD-10-CM | POA: Insufficient documentation

## 2013-11-27 ENCOUNTER — Encounter: Payer: Self-pay | Admitting: *Deleted

## 2014-06-18 ENCOUNTER — Encounter: Payer: Self-pay | Admitting: *Deleted

## 2014-12-18 ENCOUNTER — Other Ambulatory Visit (HOSPITAL_COMMUNITY)
Admission: AD | Admit: 2014-12-18 | Discharge: 2014-12-18 | Disposition: A | Discharge status: Home / Self Care | Payer: Medicare Other | Source: Skilled Nursing Facility | Attending: Internal Medicine | Admitting: Internal Medicine

## 2014-12-19 LAB — ANTINUCLEAR ANTIBODIES, IFA: ANTINUCLEAR ANTIBODIES, IFA: NEGATIVE

## 2015-02-24 ENCOUNTER — Ambulatory Visit (HOSPITAL_COMMUNITY): Payer: Medicare Other | Attending: Internal Medicine

## 2015-02-24 ENCOUNTER — Encounter (HOSPITAL_COMMUNITY)
Admission: RE | Admit: 2015-02-24 | Discharge: 2015-02-24 | Disposition: A | Discharge status: Home / Self Care | Payer: Medicare Other | Source: Skilled Nursing Facility | Attending: Internal Medicine | Admitting: Internal Medicine

## 2015-02-24 DIAGNOSIS — I10 Essential (primary) hypertension: Secondary | ICD-10-CM | POA: Diagnosis not present

## 2015-02-24 DIAGNOSIS — R6889 Other general symptoms and signs: Secondary | ICD-10-CM | POA: Diagnosis present

## 2015-02-24 DIAGNOSIS — R0602 Shortness of breath: Secondary | ICD-10-CM | POA: Diagnosis present

## 2015-02-24 DIAGNOSIS — J449 Chronic obstructive pulmonary disease, unspecified: Secondary | ICD-10-CM | POA: Diagnosis not present

## 2015-02-24 LAB — CBC WITH DIFFERENTIAL/PLATELET
Basophils Absolute: 0 10*3/uL (ref 0.0–0.1)
Basophils Relative: 0 % (ref 0–1)
EOS PCT: 1 % (ref 0–5)
Eosinophils Absolute: 0.1 10*3/uL (ref 0.0–0.7)
HEMATOCRIT: 38.1 % (ref 36.0–46.0)
Hemoglobin: 12 g/dL (ref 12.0–15.0)
LYMPHS PCT: 22 % (ref 12–46)
Lymphs Abs: 2.1 10*3/uL (ref 0.7–4.0)
MCH: 29.8 pg (ref 26.0–34.0)
MCHC: 31.5 g/dL (ref 30.0–36.0)
MCV: 94.5 fL (ref 78.0–100.0)
MONO ABS: 1 10*3/uL (ref 0.1–1.0)
Monocytes Relative: 11 % (ref 3–12)
NEUTROS PCT: 67 % (ref 43–77)
Neutro Abs: 6.3 10*3/uL (ref 1.7–7.7)
PLATELETS: 232 10*3/uL (ref 150–400)
RBC: 4.03 MIL/uL (ref 3.87–5.11)
RDW: 13.1 % (ref 11.5–15.5)
WBC: 9.5 10*3/uL (ref 4.0–10.5)

## 2015-07-07 ENCOUNTER — Ambulatory Visit (HOSPITAL_COMMUNITY): Payer: Medicare Other | Attending: Internal Medicine

## 2015-07-07 DIAGNOSIS — R05 Cough: Secondary | ICD-10-CM | POA: Insufficient documentation

## 2015-07-07 DIAGNOSIS — I517 Cardiomegaly: Secondary | ICD-10-CM | POA: Insufficient documentation

## 2015-07-07 DIAGNOSIS — R531 Weakness: Secondary | ICD-10-CM | POA: Insufficient documentation

## 2015-10-12 ENCOUNTER — Encounter (HOSPITAL_COMMUNITY)
Admission: RE | Admit: 2015-10-12 | Discharge: 2015-10-12 | Disposition: A | Discharge status: Home / Self Care | Payer: Medicare Other | Source: Skilled Nursing Facility | Attending: Internal Medicine | Admitting: Internal Medicine

## 2015-10-12 DIAGNOSIS — Z029 Encounter for administrative examinations, unspecified: Secondary | ICD-10-CM | POA: Diagnosis present

## 2015-10-12 LAB — LIPID PANEL
Cholesterol: 117 mg/dL (ref 0–200)
HDL: 29 mg/dL — ABNORMAL LOW (ref >40)
LDL CALC: 51 mg/dL (ref 0–99)
Total CHOL/HDL Ratio: 4 RATIO
Triglycerides: 184 mg/dL — ABNORMAL HIGH (ref <150)
VLDL: 37 mg/dL (ref 0–40)

## 2015-10-12 LAB — COMPREHENSIVE METABOLIC PANEL
ALK PHOS: 50 U/L (ref 38–126)
ALT: 12 U/L — AB (ref 14–54)
AST: 16 U/L (ref 15–41)
Albumin: 3.4 g/dL — ABNORMAL LOW (ref 3.5–5.0)
Anion gap: 7 (ref 5–15)
BUN: 84 mg/dL — AB (ref 6–20)
CALCIUM: 8.9 mg/dL (ref 8.9–10.3)
CHLORIDE: 116 mmol/L — AB (ref 101–111)
CO2: 26 mmol/L (ref 22–32)
CREATININE: 1.85 mg/dL — AB (ref 0.44–1.00)
GFR calc Af Amer: 27 mL/min — ABNORMAL LOW (ref >60)
GFR calc non Af Amer: 23 mL/min — ABNORMAL LOW (ref >60)
GLUCOSE: 205 mg/dL — AB (ref 65–99)
Potassium: 5 mmol/L (ref 3.5–5.1)
SODIUM: 149 mmol/L — AB (ref 135–145)
Total Bilirubin: 0.5 mg/dL (ref 0.3–1.2)
Total Protein: 6.6 g/dL (ref 6.5–8.1)

## 2015-11-03 ENCOUNTER — Emergency Department (HOSPITAL_COMMUNITY)
Admission: EM | Admit: 2015-11-03 | Discharge: 2015-11-03 | Discharge status: Absent without leave | Payer: Medicare Other

## 2015-11-18 DEATH — deceased

## 2016-12-11 IMAGING — DX DG CHEST 1V
1 series · 1 of 1 positions shown · non-contrast
Comparison: AP chest x-ray September 24, 2013

CLINICAL DATA: Shortness of breath, history of hypertension, post
traumatic subdural hematoma and organic brain syndrome

EXAM:
CHEST  1 VIEW

[chest ap strecther]
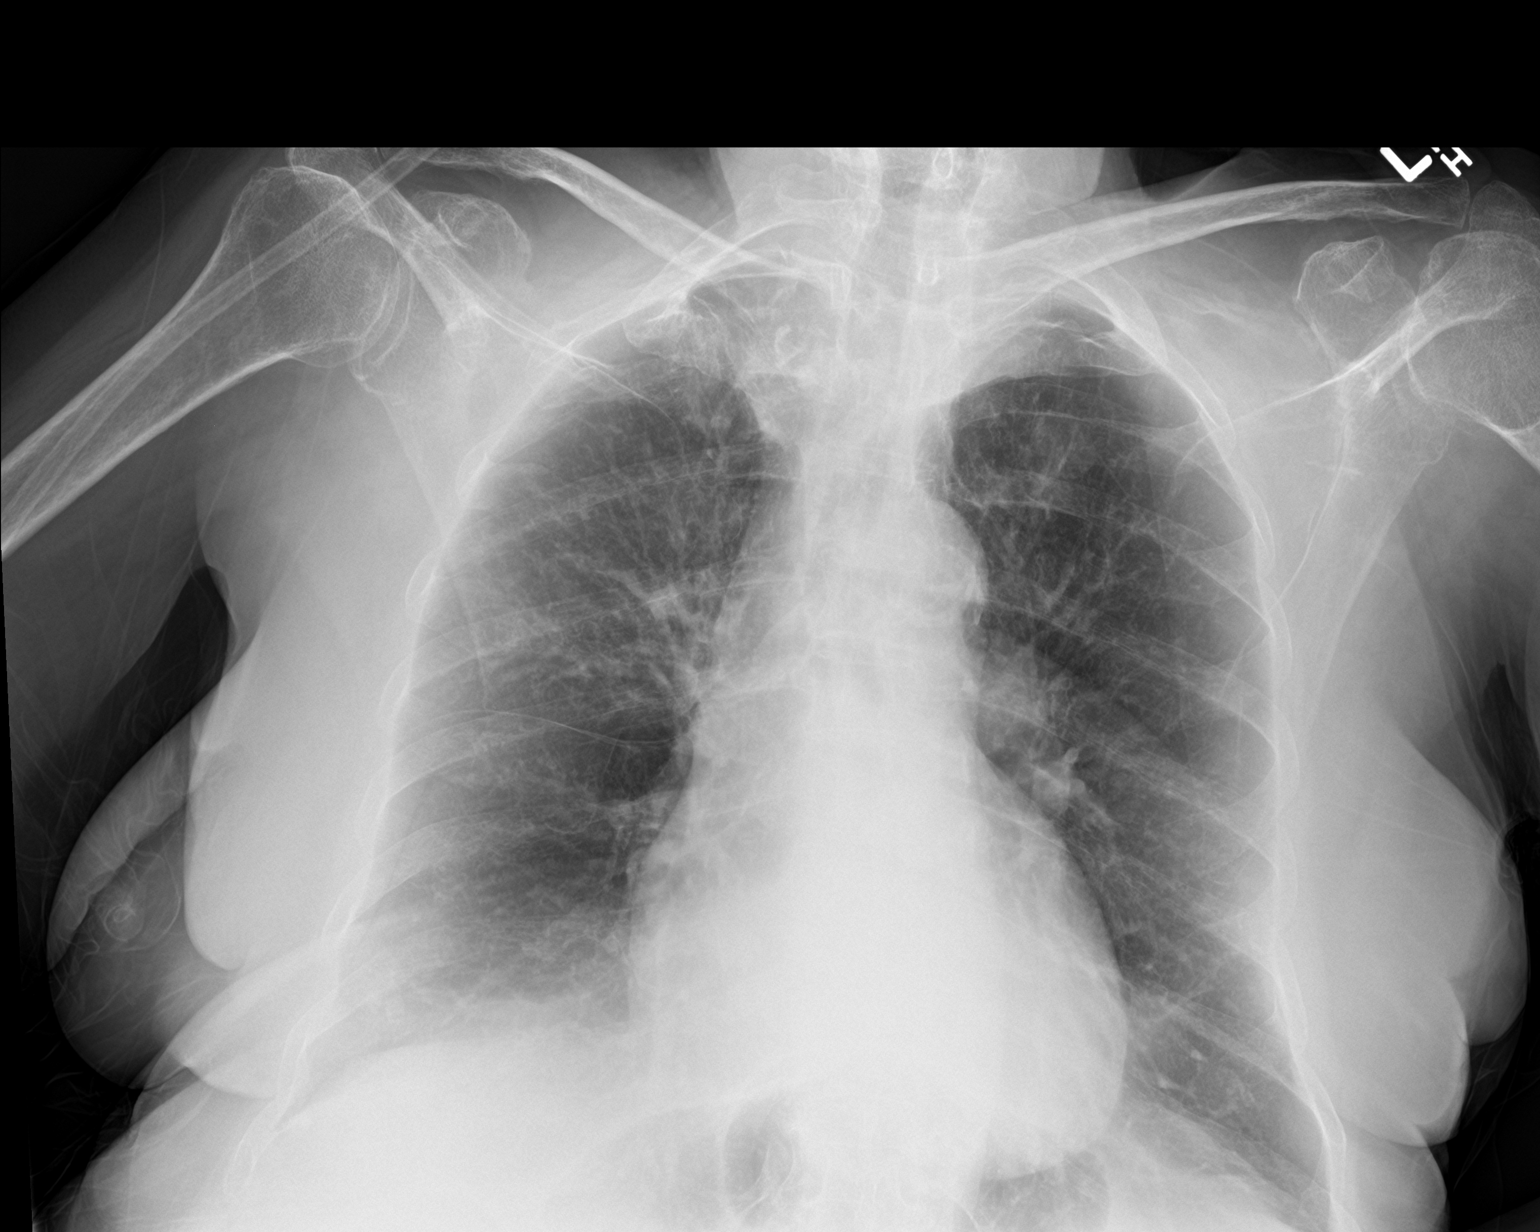

[1 of 1 positions shown; findings below may reference images not displayed]

FINDINGS: The lungs remain hyperinflated. There is no alveolar infiltrate. The
interstitial markings are coarse bilaterally. The heart and
pulmonary vascularity are normal. The mediastinum is normal in
width. The bony thorax exhibits no acute abnormality.
IMPRESSION: COPD and mild pulmonary fibrotic changes. There is no acute
cardiopulmonary abnormality.
